# Patient Record
Sex: Male | Born: 1965 | Race: White | Hispanic: No | Smoking: Never smoker
Health system: Southern US, Community
[De-identification: ages and names within clinical notes are randomized; demographics above are authoritative.]

## PROBLEM LIST (undated history)

## (undated) DIAGNOSIS — M549 Dorsalgia, unspecified: Secondary | ICD-10-CM

## (undated) DIAGNOSIS — R112 Nausea with vomiting, unspecified: Secondary | ICD-10-CM

## (undated) DIAGNOSIS — Z9889 Other specified postprocedural states: Secondary | ICD-10-CM

## (undated) DIAGNOSIS — G4730 Sleep apnea, unspecified: Secondary | ICD-10-CM

## (undated) DIAGNOSIS — M542 Cervicalgia: Secondary | ICD-10-CM

## (undated) DIAGNOSIS — K219 Gastro-esophageal reflux disease without esophagitis: Secondary | ICD-10-CM

## (undated) DIAGNOSIS — M1990 Unspecified osteoarthritis, unspecified site: Secondary | ICD-10-CM

## (undated) HISTORY — PX: KNEE ARTHROSCOPY: SHX127

## (undated) HISTORY — PX: OSTEOTOMY: SHX137

## (undated) HISTORY — PX: CARPAL TUNNEL RELEASE: SHX101

## (undated) HISTORY — DX: Gastro-esophageal reflux disease without esophagitis: K21.9

## (undated) HISTORY — DX: Cervicalgia: M54.2

## (undated) HISTORY — PX: OTHER SURGICAL HISTORY: SHX169

---

## 2006-02-19 ENCOUNTER — Ambulatory Visit (HOSPITAL_COMMUNITY): Admission: RE | Admit: 2006-02-19 | Discharge: 2006-02-19 | Payer: Self-pay | Admitting: Orthopedic Surgery

## 2013-12-26 ENCOUNTER — Encounter: Payer: Self-pay | Admitting: Family

## 2013-12-26 ENCOUNTER — Ambulatory Visit (INDEPENDENT_AMBULATORY_CARE_PROVIDER_SITE_OTHER): Payer: BC Managed Care – PPO | Admitting: Family

## 2013-12-26 ENCOUNTER — Encounter (INDEPENDENT_AMBULATORY_CARE_PROVIDER_SITE_OTHER): Payer: Self-pay

## 2013-12-26 VITALS — BP 125/79 | HR 78 | Temp 99.0°F | Ht 68.0 in | Wt 245.0 lb

## 2013-12-26 DIAGNOSIS — T1590XA Foreign body on external eye, part unspecified, unspecified eye, initial encounter: Secondary | ICD-10-CM

## 2013-12-26 DIAGNOSIS — T1591XA Foreign body on external eye, part unspecified, right eye, initial encounter: Secondary | ICD-10-CM

## 2013-12-26 NOTE — Patient Instructions (Signed)
Eye, Foreign Body The term foreign body refers to any object near, on the surface of or in the eye that should not be there. A foreign body may be a small speck of dirt or dust, a hair or eyelash, a splinter or any object. CAUSES  Foreign bodies can get in the eye by:  Flying pieces of something that was broken or destroyed (debris).  A sudden injury (trauma) to the eye. SYMPTOMS  Symptoms depend on what the foreign body is and where it is in the eye. The most common locations are:  On the inner surface of the upper or lower eyelids or on the covering of the white part of the eye (conjunctiva). Symptoms in this location are:  Irritating and painful, especially when blinking.  Feeling like something is in the eye.  On the surface of the clear covering on the front of the eye (cornea). A corneal foreign body has symptoms that:  Are painful and irritating since the cornea is very sensitive.  Form small "rust rings" around a metallic foreign body. Metallic foreign bodies stick more firmly to the surface of the cornea.  Inside the eyeball. Infection can happen fast and can be hard to treat with antibiotics. This is an extremely dangerous situation. Foreign bodies inside the eye can threaten vision. A person may even loose their eye. Foreign bodies inside the eye may cause:  Great pain.  Immediate loss of vision. DIAGNOSIS  Foreign bodies are found during an exam by an eye specialist. Those that are on the eyelids, conjunctiva or cornea are usually (but not always) easily found. When a foreign body is inside the eyeball, a cataract may form almost right away. This makes it hard for an ophthalmologist to find the foreign body. Special tests may be needed, including ultrasound testing, X-rays and CT scans. TREATMENT   Foreign bodies that are on the eyelids, conjunctiva or cornea are often removed easily and painlessly.  If the foreign body has caused a scratch or abrasion of the cornea,  antibiotic drops, ointments and/or a tight patch called a "pressure patch" may be needed. Follow-up exams will be needed for several days until the abrasion heals.  Surgery is needed right away if the foreign body is inside the eyeball. This is a medical emergency. An antibiotic therapy will likely be given to stop an infection. HOME CARE INSTRUCTIONS  The use of eye patches is not universal. Their use varies from state to state and from caregiver to caregiver. If an eye patch was applied:  Keep the eye patch on for as long as directed by your caregiver until the follow-up appointment.  Do not remove the patch to put in medications unless instructed to do so. When replacing the patch, retape it as it was before. Follow the same procedure if the patch becomes loose.  WARNING: Do not drive or operate machinery while the eye is patched. The ability to judge distances will be impaired.  Only take over-the-counter or prescription medicines for pain, discomfort or fever as directed by the caregiver. If no eye patch was applied:  Keep the eye closed as much as possible. Do not rub the eye.  Wear dark glasses as needed to protect the eyes from bright light.  Do not wear contact lenses until the eye feels normal again, or as instructed.  Wear protective eye covering if there is a risk of eye injury. This is important when working with high speed tools.  Only take over-the-counter or   prescription medicines for pain, discomfort or fever as directed by the caregiver. SEEK IMMEDIATE MEDICAL CARE IF:   Pain increases in the eye or the vision changes.  You or your child has problems with the eye patch.  The injury to the eye appears to be getting larger.  There is discharge from the injured eye.  Swelling and/or soreness (inflammation) develops around the affected eye.  You or your child has an oral temperature above 102 F (38.9 C), not controlled by medicine.  Your baby is older than 3  months with a rectal temperature of 102 F (38.9 C) or higher.  Your baby is 3 months old or younger with a rectal temperature of 100.4 F (38 C) or higher. MAKE SURE YOU:   Understand these instructions.  Will watch your condition.  Will get help right away if you are not doing well or get worse. Document Released: 04/27/2005 Document Revised: 07/20/2011 Document Reviewed: 09/22/2012 Exit[employer] Patient Information 2015 [employer]. This information is not intended to replace advice given to you by your health care provider. Make sure you discuss any questions you have with your health care provider.  

## 2013-12-26 NOTE — Progress Notes (Signed)
   Subjective:    Patient ID: Mason Ross, male    DOB: May 20, 1965, 48 y.o.   MRN: [id number]  Eye Pain    Pt presents to the office for something in his right eye since yesterday. Pt states he felt something in it yesterday, but can not seem to get it out. Pt states he has used eye drops and flushed with 32 oz of water this AM, but still feels like something is in there. Pt denies any pain, SOB, palpations, or edema.    Review of Systems  Constitutional: Negative.   HENT: Negative.   Eyes: Positive for pain.  Respiratory: Negative.   Cardiovascular: Negative.   Gastrointestinal: Negative.   Endocrine: Negative.   Genitourinary: Negative.   Musculoskeletal: Negative.   Neurological: Negative.   Hematological: Negative.   Psychiatric/Behavioral: Negative.   All other systems reviewed and are negative.      Objective:   Physical Exam  Vitals reviewed. Constitutional: He is oriented to person, place, and time. He appears well-developed and well-nourished. No distress.  HENT:  Head: Normocephalic.  Right Ear: External ear normal.  Left Ear: External ear normal.  Mouth/Throat: Oropharynx is clear and moist.  Eyes: Right eye exhibits no discharge. Left eye exhibits no discharge.  Right eye irritated and erythemas    Neck: Normal range of motion. Neck supple. No thyromegaly present.  Cardiovascular: Normal rate, regular rhythm, normal heart sounds and intact distal pulses.   No murmur heard. Pulmonary/Chest: Effort normal and breath sounds normal. No respiratory distress. He has no wheezes.  Abdominal: Soft. Bowel sounds are normal. He exhibits no distension. There is no tenderness.  Musculoskeletal: Normal range of motion. He exhibits no edema and no tenderness.  Neurological: He is alert and oriented to person, place, and time. He has normal reflexes. No cranial nerve deficit.  Skin: Skin is warm and dry. No rash noted. No erythema.  Psychiatric: He has a normal mood and  affect. His behavior is normal. Judgment and thought content normal.    BP 125/79  Pulse 78  Temp(Src) 99 F (37.2 C) (Oral)  Ht 5\' 8"  (1.727 m)  Wt 245 lb (111.131 kg)  BMI 37.26 kg/m2   Right flushed with Sterile eye irrigating solution for several minutes     Assessment & Plan:  1. Foreign body in eye, right, initial encounter -Do not rub eye -If pt develops a fever or redness gets worse-Pt to call to get antibiotic  -keep appointment with eye Dr on Monday  Jannifer Rodneyhristy Hawks, FNP

## 2019-04-11 DIAGNOSIS — U071 COVID-19: Secondary | ICD-10-CM

## 2019-04-11 HISTORY — DX: COVID-19: U07.1

## 2019-04-28 ENCOUNTER — Encounter (HOSPITAL_COMMUNITY): Payer: Self-pay | Admitting: *Deleted

## 2019-04-28 ENCOUNTER — Emergency Department (HOSPITAL_COMMUNITY)
Admission: EM | Admit: 2019-04-28 | Discharge: 2019-04-28 | Disposition: A | Discharge status: Home / Self Care | Payer: No Typology Code available for payment source | Attending: Emergency Medicine | Admitting: Emergency Medicine

## 2019-04-28 ENCOUNTER — Emergency Department (HOSPITAL_COMMUNITY): Payer: No Typology Code available for payment source

## 2019-04-28 ENCOUNTER — Other Ambulatory Visit: Payer: Self-pay

## 2019-04-28 DIAGNOSIS — U071 COVID-19: Secondary | ICD-10-CM | POA: Insufficient documentation

## 2019-04-28 DIAGNOSIS — R7989 Other specified abnormal findings of blood chemistry: Secondary | ICD-10-CM

## 2019-04-28 DIAGNOSIS — R739 Hyperglycemia, unspecified: Secondary | ICD-10-CM | POA: Insufficient documentation

## 2019-04-28 DIAGNOSIS — R509 Fever, unspecified: Secondary | ICD-10-CM | POA: Diagnosis present

## 2019-04-28 HISTORY — DX: Dorsalgia, unspecified: M54.9

## 2019-04-28 HISTORY — DX: Unspecified osteoarthritis, unspecified site: M19.90

## 2019-04-28 LAB — URINALYSIS, ROUTINE W REFLEX MICROSCOPIC
Bilirubin Urine: NEGATIVE
Glucose, UA: NEGATIVE mg/dL
Hgb urine dipstick: NEGATIVE
Ketones, ur: NEGATIVE mg/dL
Leukocytes,Ua: NEGATIVE
Nitrite: NEGATIVE
Protein, ur: NEGATIVE mg/dL
Specific Gravity, Urine: 1.005 (ref 1.005–1.030)
pH: 6 (ref 5.0–8.0)

## 2019-04-28 LAB — CBC WITH DIFFERENTIAL/PLATELET
Abs Immature Granulocytes: 0.01 10*3/uL (ref 0.00–0.07)
Basophils Absolute: 0 10*3/uL (ref 0.0–0.1)
Basophils Relative: 0 %
Eosinophils Absolute: 0 10*3/uL (ref 0.0–0.5)
Eosinophils Relative: 1 %
HCT: 47.8 % (ref 39.0–52.0)
Hemoglobin: 16.2 g/dL (ref 13.0–17.0)
Immature Granulocytes: 0 %
Lymphocytes Relative: 19 %
Lymphs Abs: 0.8 10*3/uL (ref 0.7–4.0)
MCH: 29.9 pg (ref 26.0–34.0)
MCHC: 33.9 g/dL (ref 30.0–36.0)
MCV: 88.2 fL (ref 80.0–100.0)
Monocytes Absolute: 0.4 10*3/uL (ref 0.1–1.0)
Monocytes Relative: 11 %
Neutro Abs: 2.9 10*3/uL (ref 1.7–7.7)
Neutrophils Relative %: 69 %
Platelets: 167 10*3/uL (ref 150–400)
RBC: 5.42 MIL/uL (ref 4.22–5.81)
RDW: 12.8 % (ref 11.5–15.5)
WBC: 4.2 10*3/uL (ref 4.0–10.5)
nRBC: 0 % (ref 0.0–0.2)

## 2019-04-28 LAB — COMPREHENSIVE METABOLIC PANEL
ALT: 82 U/L — ABNORMAL HIGH (ref 0–44)
AST: 58 U/L — ABNORMAL HIGH (ref 15–41)
Albumin: 3.9 g/dL (ref 3.5–5.0)
Alkaline Phosphatase: 52 U/L (ref 38–126)
Anion gap: 12 (ref 5–15)
BUN: 9 mg/dL (ref 6–20)
CO2: 23 mmol/L (ref 22–32)
Calcium: 8.6 mg/dL — ABNORMAL LOW (ref 8.9–10.3)
Chloride: 102 mmol/L (ref 98–111)
Creatinine, Ser: 1.21 mg/dL (ref 0.61–1.24)
GFR calc Af Amer: >60 mL/min (ref >60)
GFR calc non Af Amer: >60 mL/min (ref >60)
Glucose, Bld: 177 mg/dL — ABNORMAL HIGH (ref 70–99)
Potassium: 3.9 mmol/L (ref 3.5–5.1)
Sodium: 137 mmol/L (ref 135–145)
Total Bilirubin: 0.7 mg/dL (ref 0.3–1.2)
Total Protein: 7.5 g/dL (ref 6.5–8.1)

## 2019-04-28 LAB — RESPIRATORY PANEL BY RT PCR (FLU A&B, COVID)
Influenza A by PCR: NEGATIVE
Influenza B by PCR: NEGATIVE
SARS Coronavirus 2 by RT PCR: POSITIVE — AB

## 2019-04-28 MED ORDER — ALBUTEROL SULFATE HFA 108 (90 BASE) MCG/ACT IN AERS
2.0000 | INHALATION_SPRAY | Freq: Once | RESPIRATORY_TRACT | Status: AC
  Administered 2019-04-28: 10:00:00 2 via RESPIRATORY_TRACT
  Filled 2019-04-28: qty 6.7

## 2019-04-28 MED ORDER — ACETAMINOPHEN 500 MG PO TABS
1000.0000 mg | ORAL_TABLET | Freq: Once | ORAL | Status: DC
  Filled 2019-04-28: qty 2

## 2019-04-28 MED ORDER — SODIUM CHLORIDE 0.9 % IV BOLUS
500.0000 mL | Freq: Once | INTRAVENOUS | Status: AC
  Administered 2019-04-28: 500 mL via INTRAVENOUS

## 2019-04-28 MED ORDER — FLUTICASONE PROPIONATE 50 MCG/ACT NA SUSP: 1.0000 | Freq: Every day | NASAL | 0 refills | Status: DC

## 2019-04-28 MED ORDER — NAPROXEN 500 MG PO TABS: 500.0000 mg | ORAL_TABLET | Freq: Two times a day (BID) | ORAL | 0 refills | Status: DC

## 2019-04-28 MED ORDER — AEROCHAMBER PLUS FLO-VU MEDIUM MISC
1.0000 | Freq: Once | Status: AC
  Administered 2019-04-28: 1
  Filled 2019-04-28: qty 1

## 2019-04-28 MED ORDER — BENZONATATE 100 MG PO CAPS: 100.0000 mg | ORAL_CAPSULE | Freq: Three times a day (TID) | ORAL | 0 refills | Status: DC

## 2019-04-28 NOTE — ED Notes (Signed)
Date and time results received: 04/28/19 1038 (use smartphrase ".now" to insert current time)  Test: Covid Critical Value: Positive  Name of Provider Notified: S. Petrucelli PA  Orders Received? Or Actions Taken?: NA

## 2019-04-28 NOTE — ED Triage Notes (Signed)
Fever, onset yesterday

## 2019-04-28 NOTE — ED Provider Notes (Signed)
[facility] EMERGENCY DEPARTMENT Provider Note   CSN: [id number] Arrival date & time: 04/28/19  1607     History Chief Complaint  Patient presents with  . Fever    Mason Ross is a 53 y.o. male with a history of GERD who presents to the ED with complaints of fever for the past 5 days. Patient states he has had fever, chills, body aches, nasal congestion, ear pain, cough productive of phlegm sputum, dyspnea & chest pain with coughing otherwise no significant chest pain. Taking OTC NSAIDs at home with minimal relief. No other alleviating/aggravating factors. No recent known covid 19 exposure. Denies N/V/D, abdominal pain, syncope,  leg pain/swelling, hemoptysis, recent surgery/trauma, recent long travel, hormone use, personal hx of cancer, or hx of DVT/PE.    HPI     Past Medical History:  Diagnosis Date  . Arthritis   . Back pain   . GERD (gastroesophageal reflux disease)     There are no problems to display for this patient.   Past Surgical History:  Procedure Laterality Date  . CARPAL TUNNEL RELEASE Bilateral   . KNEE ARTHROSCOPY    . OSTEOTOMY Bilateral    arms       Family History  Problem Relation Age of Onset  . Cancer Father        prostate    Social History   Tobacco Use  . Smoking status: Never Smoker  . Smokeless tobacco: Never Used  Substance Use Topics  . Alcohol use: Yes    Alcohol/week: 6.0 standard drinks    Types: 6 Cans of beer per week  . Drug use: No    Home Medications Prior to Admission medications   Medication Sig Start Date End Date Taking? Authorizing Provider  omeprazole (PRILOSEC) 20 MG capsule Take 20 mg by mouth daily.    [provider]    Allergies    Patient has no known allergies.  Review of Systems   Review of Systems  Constitutional: Positive for chills, fatigue and fever.  HENT: Positive for congestion, ear pain and sore throat.   Respiratory: Positive for cough and shortness of breath.    Cardiovascular: Positive for chest pain (with coughing). Negative for leg swelling.  Gastrointestinal: Negative for abdominal pain, diarrhea, nausea and vomiting.  Musculoskeletal: Positive for arthralgias and myalgias.  Skin: Negative for rash.  Neurological: Negative for syncope, weakness and numbness.  All other systems reviewed and are negative.   Physical Exam Updated Vital Signs BP 131/69   Pulse 92   Temp 100 F (37.8 C)   Resp 20   Ht 5' 6.5" (1.689 m)   Wt 117.9 kg   SpO2 92%   BMI 41.34 kg/m   Physical Exam Vitals and nursing note reviewed.  Constitutional:      General: He is not in acute distress.    Appearance: He is well-developed. He is not toxic-appearing.  HENT:     Head: Normocephalic and atraumatic.     Right Ear: Ear canal normal. Tympanic membrane is not perforated, erythematous, retracted or bulging.     Left Ear: Ear canal normal. Tympanic membrane is not perforated, erythematous, retracted or bulging.     Ears:     Comments: No mastoid erythema/swellng/tenderness.     Nose:     Right Sinus: No maxillary sinus tenderness or frontal sinus tenderness.     Left Sinus: No maxillary sinus tenderness or frontal sinus tenderness.     Mouth/Throat:  Pharynx: Oropharynx is clear. Uvula midline. No oropharyngeal exudate or posterior oropharyngeal erythema.     Comments: Posterior oropharynx is symmetric appearing. Patient tolerating own secretions without difficulty. No trismus. No drooling. No hot potato voice. No swelling beneath the tongue, submandibular compartment is soft.  Eyes:     General:        Right eye: No discharge.        Left eye: No discharge.     Conjunctiva/sclera: Conjunctivae normal.  Cardiovascular:     Rate and Rhythm: Normal rate and regular rhythm.  Pulmonary:     Effort: Pulmonary effort is normal. No respiratory distress.     Breath sounds: Normal breath sounds. No wheezing, rhonchi or rales.     Comments: SpO2 97% on RA @  rest, maintaining SpO2  > 95% with ambulation.  Chest:     Chest wall: Tenderness (anterior chest wall) present.  Abdominal:     General: There is no distension.     Palpations: Abdomen is soft.     Tenderness: There is no abdominal tenderness. There is no right CVA tenderness, left CVA tenderness, guarding or rebound.  Musculoskeletal:     Cervical back: Neck supple. No rigidity.  Lymphadenopathy:     Cervical: No cervical adenopathy.  Skin:    General: Skin is warm and dry.     Findings: No rash.  Neurological:     Mental Status: He is alert.  Psychiatric:        Behavior: Behavior normal.     ED Results / Procedures / Treatments   Labs (all labs ordered are listed, but only abnormal results are displayed) Labs Reviewed  RESPIRATORY PANEL BY RT PCR (FLU A&B, COVID) - Abnormal; Notable for the following components:      Result Value   SARS Coronavirus 2 by RT PCR POSITIVE (*)    All other components within normal limits  COMPREHENSIVE METABOLIC PANEL - Abnormal; Notable for the following components:   Glucose, Bld 177 (*)    Calcium 8.6 (*)    AST 58 (*)    ALT 82 (*)    All other components within normal limits  URINALYSIS, ROUTINE W REFLEX MICROSCOPIC - Abnormal; Notable for the following components:   Color, Urine STRAW (*)    All other components within normal limits  CBC WITH DIFFERENTIAL/PLATELET    EKG EKG Interpretation  Date/Time:  Friday April 28 2019 09:27:28 EST Ventricular Rate:  88 PR Interval:    QRS Duration: 101 QT Interval:  357 QTC Calculation: 432 R Axis:   51 Text Interpretation: Sinus rhythm Low voltage, precordial leads Probable anteroseptal infarct, old Confirmed by Elnora Morrison [phone]) on 04/28/2019 11:46:45 AM   Radiology DG Chest Port 1 View  Result Date: 04/28/2019 CLINICAL DATA:  Dyspnea EXAM: PORTABLE CHEST 1 VIEW COMPARISON:  None. FINDINGS: Heart size appears mildly enlarged. Airspace opacity within the peripheral aspect  of the left lower lobe. Right lung appears clear. No pleural effusion or pneumothorax. IMPRESSION: 1. Airspace opacity within the peripheral aspect of the left lower lobe, suspicious for pneumonia. Radiographic follow-up to resolution is recommended. 2. Mild cardiomegaly. Electronically Signed   By: Davina Poke M.D.   On: 04/28/2019 09:51    Procedures Procedures (including critical care time)  Medications Ordered in ED Medications - No data to display  ED Course  I have reviewed the triage vital signs and the nursing notes.  Pertinent labs & imaging results that were available during my  care of the patient were reviewed by me and considered in my medical decision making (see chart for details).    MDM Rules/Calculators/A&P                     Patient presents to the ED with fever, body aches, URI sxs, & respiratory sxs over the past 5 days.  He is nontoxic appearing, initial temp 100, SpO2 on my assessment 92% @ rest. Lungs CTA. Not in respiraotry distress. Chest discomfort reproducible with chest wall palpation. Plan for labs, CXR, & covid/influenza testing.   EKG: no STEMI CBC: No anemia/leukocytosis.  CMP: Hyperglycemia w/o acidosis or anion gap elevation. Mild hypocalcemia. LFTs mildly elevated, no prior on records. Renal function preserved.  UA: No UTI CXR:  1. Airspace opacity within the peripheral aspect of the left lower lobe, suspicious for pneumonia. Radiographic follow-up to resolution is recommended. 2. Mild cardiomegaly.  Influenza: Negative COVID: Positive   Suspect symptoms are primarily due to COVID-19.  Chest pain reproducible.  Lungs clear.  Low risk Wells, doubt PE.  Laboratory work-up overall reassuring.  I personally ambulated the patient and he maintained his SPO2 at greater than 95%.  He appears appropriate for discharge home with supportive care.  His chest x-ray does show findings of pneumonia, discussed with supervising physician Dr. Jodi MourningZavitz who has reviewed  imaging and would not give antibiotics which I am in agreement with.  I discussed results, treatment plan, need for follow-up, and return precautions with the patient. Provided opportunity for questions, patient confirmed understanding and is in agreement with plan.   Findings and plan of care discussed with supervising physician Dr. Jodi MourningZavitz who is in agreement.    Mason Ross was evaluated in Emergency Department on 04/28/2019 for the symptoms described in the history of present illness. He/she was evaluated in the context of the global COVID-19 pandemic, which necessitated consideration that the patient might be at risk for infection with the SARS-CoV-2 virus that causes COVID-19. Institutional protocols and algorithms that pertain to the evaluation of patients at risk for COVID-19 are in a state of rapid change based on information released by regulatory bodies including the CDC and federal and state organizations. These policies and algorithms were followed during the patient's care in the ED.   Final Clinical Impression(s) / ED Diagnoses Final diagnoses:  COVID-19  Hyperglycemia  Elevated LFTs    Rx / DC Orders ED Discharge Orders         Ordered    fluticasone (FLONASE) 50 MCG/ACT nasal spray  Daily     04/28/19 1152    benzonatate (TESSALON) 100 MG capsule  Every 8 hours     04/28/19 1152    naproxen (NAPROSYN) 500 MG tablet  2 times daily     04/28/19 [address]1152           Jhett Fretwell, Duane LakeSamantha R, PA-C 04/28/19 1213    Blane OharaZavitz, Joshua, MD 04/29/19 [phone]

## 2019-04-28 NOTE — Discharge Instructions (Addendum)
You were seen in the emergency department today for fever and trouble breathing.  Your work-up showed that you are positive for COVID-19 which is likely causing your symptoms.  Your labs did show that your liver function tests were mildly elevated, this is likely due to your COVID-19 infection but should be rechecked by primary care provider in 1 to 2 weeks.  Your blood sugar was noted to be elevated, have this rechecked as well.   Your chest x-ray showed findings of pneumonia, this is likely due to COVID-19.  We would like you to have a repeat chest x-ray in 1 to 2 weeks to ensure improvement.  We are sending you home with the following medicines: -Flonase: This is medicine to help with nasal congestion, use 1 spray per nostril daily -Tessalon: This is a medicine to help with coughing, use every 8 hours as needed -Albuterol inhaler: Use 1 to 2 puffs every 4 6 hours as needed for wheezing/shortness of breath -Naproxen: This is a medicine to help with headache and chest pain with coughing.  You may take this medicine every 12 hours as needed.  Be sure to take this with food as it can cause stomach upset and or stomach bleeding.  Do not take this with other NSAIDs such as ibuprofen, Advil, Aleve, Motrin, Goody powder, Mobic, etc.  We have prescribed you new medication(s) today. Discuss the medications prescribed today with your pharmacist as they can have adverse effects and interactions with your other medicines including over the counter and prescribed medications. Seek medical evaluation if you start to experience new or abnormal symptoms after taking one of these medicines, seek care immediately if you start to experience difficulty breathing, feeling of your throat closing, facial swelling, or rash as these could be indications of a more serious allergic reaction  We are instructing patients with COVID-19 to quarantine themselves for 14 days. You may be able to discontinue self quarantine if the  following conditions are met:   Persons with COVID-19 who have symptoms and were directed to care for themselves at home may discontinue home isolation under the  following conditions: - It has been at least 7 days have passed since symptoms first appeared. - AND at least 3 days (72 hours) have passed since recovery defined as resolution of fever without the use of fever-reducing medications and improvement in respiratory symptoms (e.g., cough, shortness of breath)  Please follow the below quarantine instructions.   Please follow up with primary care within 3-5 days for re-evaluation- call prior to going to the office to make them aware of your symptoms. Return to the ER for new or worsening symptoms including but not limited to increased work of breathing, chest pain, passing out, or any other concerns.       Person Under Monitoring Name: Mason Ross  Location: [address] [place] [address]   Infection Prevention Recommendations for Individuals Confirmed to have, or Being Evaluated for, 2019 Novel Coronavirus (COVID-19) Infection Who Receive Care at Home  Individuals who are confirmed to have, or are being evaluated for, COVID-19 should follow the prevention steps below until a healthcare provider or local or state health department says they can return to normal activities.  Stay home except to get medical care You should restrict activities outside your home, except for getting medical care. Do not go to work, school, or public areas, and do not use public transportation or taxis.  Call ahead before visiting your doctor Before your medical  appointment, call the healthcare provider and tell them that you have, or are being evaluated for, COVID-19 infection. This will help the healthcare provider's office take steps to keep other people from getting infected. Ask your healthcare provider to call the local or state health department.  Monitor your symptoms Seek prompt  medical attention if your illness is worsening (e.g., difficulty breathing). Before going to your medical appointment, call the healthcare provider and tell them that you have, or are being evaluated for, COVID-19 infection. Ask your healthcare provider to call the local or state health department.  Wear a facemask You should wear a facemask that covers your nose and mouth when you are in the same room with other people and when you visit a healthcare provider. People who live with or visit you should also wear a facemask while they are in the same room with you.  Separate yourself from other people in your home As much as possible, you should stay in a different room from other people in your home. Also, you should use a separate bathroom, if available.  Avoid sharing household items You should not share dishes, drinking glasses, cups, eating utensils, towels, bedding, or other items with other people in your home. After using these items, you should wash them thoroughly with soap and water.  Cover your coughs and sneezes Cover your mouth and nose with a tissue when you cough or sneeze, or you can cough or sneeze into your sleeve. Throw used tissues in a lined trash can, and immediately wash your hands with soap and water for at least 20 seconds or use an alcohol-based hand rub.  Wash your [facility] your hands often and thoroughly with soap and water for at least 20 seconds. You can use an alcohol-based hand sanitizer if soap and water are not available and if your hands are not visibly dirty. Avoid touching your eyes, nose, and mouth with unwashed hands.   Prevention Steps for Caregivers and Household Members of Individuals Confirmed to have, or Being Evaluated for, COVID-19 Infection Being Cared for in the Home  If you live with, or provide care at home for, a person confirmed to have, or being evaluated for, COVID-19 infection please follow these guidelines to prevent  infection:  Follow healthcare provider's instructions Make sure that you understand and can help the patient follow any healthcare provider instructions for all care.  Provide for the patient's basic needs You should help the patient with basic needs in the home and provide support for getting groceries, prescriptions, and other personal needs.  Monitor the patient's symptoms If they are getting sicker, call his or her medical provider and tell them that the patient has, or is being evaluated for, COVID-19 infection. This will help the healthcare provider's office take steps to keep other people from getting infected. Ask the healthcare provider to call the local or state health department.  Limit the number of people who have contact with the patient If possible, have only one caregiver for the patient. Other household members should stay in another home or place of residence. If this is not possible, they should stay in another room, or be separated from the patient as much as possible. Use a separate bathroom, if available. Restrict visitors who do not have an essential need to be in the home.  Keep older adults, very young children, and other sick people away from the patient Keep older adults, very young children, and those who have compromised immune systems  or chronic health conditions away from the patient. This includes people with chronic heart, lung, or kidney conditions, diabetes, and cancer.  Ensure good ventilation Make sure that shared spaces in the home have good air flow, such as from an air conditioner or an opened window, weather permitting.  Wash your hands often Wash your hands often and thoroughly with soap and water for at least 20 seconds. You can use an alcohol based hand sanitizer if soap and water are not available and if your hands are not visibly dirty. Avoid touching your eyes, nose, and mouth with unwashed hands. Use disposable paper towels to dry your  hands. If not available, use dedicated cloth towels and replace them when they become wet.  Wear a facemask and gloves Wear a disposable facemask at all times in the room and gloves when you touch or have contact with the patient's blood, body fluids, and/or secretions or excretions, such as sweat, saliva, sputum, nasal mucus, vomit, urine, or feces.  Ensure the mask fits over your nose and mouth tightly, and do not touch it during use. Throw out disposable facemasks and gloves after using them. Do not reuse. Wash your hands immediately after removing your facemask and gloves. If your personal clothing becomes contaminated, carefully remove clothing and launder. Wash your hands after handling contaminated clothing. Place all used disposable facemasks, gloves, and other waste in a lined container before disposing them with other household waste. Remove gloves and wash your hands immediately after handling these items.  Do not share dishes, glasses, or other household items with the patient Avoid sharing household items. You should not share dishes, drinking glasses, cups, eating utensils, towels, bedding, or other items with a patient who is confirmed to have, or being evaluated for, COVID-19 infection. After the person uses these items, you should wash them thoroughly with soap and water.  Wash laundry thoroughly Immediately remove and wash clothes or bedding that have blood, body fluids, and/or secretions or excretions, such as sweat, saliva, sputum, nasal mucus, vomit, urine, or feces, on them. Wear gloves when handling laundry from the patient. Read and follow directions on labels of laundry or clothing items and detergent. In general, wash and dry with the warmest temperatures recommended on the label.  Clean all areas the individual has used often Clean all touchable surfaces, such as counters, tabletops, doorknobs, bathroom fixtures, toilets, phones, keyboards, tablets, and bedside tables,  every day. Also, clean any surfaces that may have blood, body fluids, and/or secretions or excretions on them. Wear gloves when cleaning surfaces the patient has come in contact with. Use a diluted bleach solution (e.g., dilute bleach with 1 part bleach and 10 parts water) or a household disinfectant with a label that says EPA-registered for coronaviruses. To make a bleach solution at home, add 1 tablespoon of bleach to 1 quart (4 cups) of water. For a larger supply, add  cup of bleach to 1 gallon (16 cups) of water. Read labels of cleaning products and follow recommendations provided on product labels. Labels contain instructions for safe and effective use of the cleaning product including precautions you should take when applying the product, such as wearing gloves or eye protection and making sure you have good ventilation during use of the product. Remove gloves and wash hands immediately after cleaning.  Monitor yourself for signs and symptoms of illness Caregivers and household members are considered close contacts, should monitor their health, and will be asked to limit movement outside of the home  to the extent possible. Follow the monitoring steps for close contacts listed on the symptom monitoring form.   ? If you have additional questions, contact your local health department or call the epidemiologist on call at [phone] (available 24/7). ? This guidance is subject to change. For the most up-to-date guidance from [facility], please refer to their website: [email or web address]

## 2019-04-28 NOTE — ED Notes (Signed)
Also c/o body aches and feeling short of breath

## 2019-09-19 ENCOUNTER — Encounter (INDEPENDENT_AMBULATORY_CARE_PROVIDER_SITE_OTHER): Payer: No Typology Code available for payment source | Admitting: Neurology

## 2019-09-19 ENCOUNTER — Other Ambulatory Visit: Payer: Self-pay

## 2019-09-19 ENCOUNTER — Ambulatory Visit (INDEPENDENT_AMBULATORY_CARE_PROVIDER_SITE_OTHER): Payer: No Typology Code available for payment source | Admitting: Neurology

## 2019-09-19 DIAGNOSIS — M5412 Radiculopathy, cervical region: Secondary | ICD-10-CM

## 2019-09-19 DIAGNOSIS — Z0289 Encounter for other administrative examinations: Secondary | ICD-10-CM

## 2019-09-19 DIAGNOSIS — R200 Anesthesia of skin: Secondary | ICD-10-CM

## 2019-09-19 DIAGNOSIS — M5416 Radiculopathy, lumbar region: Secondary | ICD-10-CM | POA: Diagnosis not present

## 2019-09-19 DIAGNOSIS — M79609 Pain in unspecified limb: Secondary | ICD-10-CM | POA: Insufficient documentation

## 2019-09-19 NOTE — Progress Notes (Signed)
Full Name: Deloyd Handy Gender: Male MRN #: [id number] Date of Birth: [birth year]    Visit Date: 09/19/2019 07:50 Age: 54 Years Examining Physician: Despina Arias, MD  Referring Physician: Carmel Sacramento, NP Height: 5 feet 6 inch    History: Mr. Hendershott is a 53 year old man with right greater than left neck pain and pain in both arms, right greater than left.  He also has pain in the lower back and the right leg.  On exam, he had slight asymmetry of biceps strength, weaker on the right.  Strength and sensation was normal elsewhere.  Nerve conduction studies: The right median, ulnar, peroneal and tibial motor responses had normal distal latencies, amplitudes and conduction velocities.  The tibial and ulnar F-wave responses had normal latencies.  The right sural, superficial peroneal, median and ulnar sensory responses had normal peak latencies and amplitudes.  The sympathetic skin response of the foot was normal.  Electromyography: Needle EMG of selected muscles of the right arm and leg was performed.  In the arm, some of the C6 and/or C7 innervated muscles showed mild chronic denervation.  In the leg, some of the L4/L3 innervated muscles had either mild chronic denervation or a mildly increased number of polyphasic motor units.  There was no abnormal spontaneous activity in any of the muscles tested.  Impression: This NCV/EMG study shows the following: 1.    There is no evidence of a polyneuropathy or mononeuropathy in the right arm or leg. 2.    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 Epimenio Foot, MD, PhD, FAAN Certified in Neurology, Clinical Neurophysiology, Sleep Medicine, Pain Medicine and Neuroimaging Director, [facility] at [facility]  [facility] [address] [place], [place] [address] [phone]  Verbal informed consent was obtained  from the patient, patient was informed of potential risk of procedure, including bruising, bleeding, hematoma formation, infection, muscle weakness, muscle pain, numbness, among others.        MNC    Nerve / Sites Muscle Latency Ref. Amplitude Ref. Rel Amp Segments Distance Velocity Ref. Area    ms ms mV mV %  cm m/s m/s mVms  R Median - APB     Wrist APB 3.7 ?4.4 11.2 ?4.0 100 Wrist - APB 7   34.3     Upper arm APB 7.7  10.1  89.7 Upper arm - Wrist 21 52 ?49 31.5  R Ulnar - ADM     Wrist ADM 2.4 ?3.3 12.6 ?6.0 100 Wrist - ADM 7   38.8     B.Elbow ADM 6.2  12.0  95.5 B.Elbow - Wrist 21 54 ?49 36.4     A.Elbow ADM 8.1  11.5  95.8 A.Elbow - B.Elbow 10 53 ?49 36.2  R Peroneal - EDB     Ankle EDB 4.0 ?6.5 15.0 ?2.0 100 Ankle - EDB 9   43.7     Fib head EDB 10.4  13.0  86.6 Fib head - Ankle 29 45 ?44 38.6     Pop fossa EDB 11.8  14.3  110 Pop fossa - Fib head 10 68 ?44 45.8         Pop fossa - Ankle      R Tibial - AH     Ankle AH 3.7 ?5.8 13.3 ?4.0 100 Ankle - AH 9   27.7     Pop fossa  AH 12.4  8.0  59.8 Pop fossa - Ankle 40 46 ?41 25.5             SSR      SNC    Nerve / Sites Rec. Site Peak Lat Ref.  Amp Ref. Segments Distance    ms ms V V  cm  R Sural - Ankle (Calf)     Calf Ankle 3.5 ?4.4 20 ?6 Calf - Ankle 14  R Superficial peroneal - Ankle     Lat leg Ankle 4.0 ?4.4 14 ?6 Lat leg - Ankle 14  R Median - Orthodromic (Dig II, Mid palm)     Dig II Wrist 3.0 ?3.4 19 ?10 Dig II - Wrist 13  R Ulnar - Orthodromic, (Dig V, Mid palm)     Dig V Wrist 2.5 ?3.1 9 ?5 Dig V - Wrist 5             F  Wave    Nerve F Lat Ref.   ms ms  R Tibial - AH 50.2 ?56.0  R Ulnar - ADM 29.1 ?32.0         EMG Summary Table    Spontaneous MUAP Recruitment  Muscle IA Fib PSW Fasc Other Amp Dur. Poly Pattern  R. Deltoid Normal None None None _______ Normal Normal 1+ Normal  R. Triceps brachii Normal None None None _______ Normal Normal Normal Normal  R. Biceps brachii Normal None None None  _______ Normal Normal 1+ Reduced  R. Pronator teres Normal None None None _______ Normal Normal Normal Normal  R. Extensor digitorum communis Normal None None None _______ Normal Normal 1+ Reduced  R. Flexor carpi ulnaris Normal None None None _______ Normal Normal Normal Normal  R. First dorsal interosseous Normal None None None _______ Normal Normal 1+ Normal  R. Supraspinatus Normal None None None _______ Normal Normal 1+ Reduced  R. Vastus medialis Normal None None None _______ Normal Normal 1+ Normal  R. Tibialis anterior Normal None None None _______ Normal Normal 1+ Reduced  R. Peroneus longus Normal None None None _______ Normal Normal Normal Normal  R. Gastrocnemius (Medial head) Normal None None None _______ Normal Normal Normal Normal  R. Gluteus medius Normal None None None _______ Normal Normal Normal Normal  R. Iliopsoas Normal None None None _______ Normal Normal 1+ Normal

## 2019-10-02 ENCOUNTER — Encounter: Payer: Self-pay | Admitting: Nurse Practitioner

## 2019-12-11 NOTE — Progress Notes (Signed)
Primary Care Physician:  [facility] Primary Gastroenterologist:  Dr. Marletta Lor  Chief Complaint  Patient presents with  . Colonoscopy    HPI:   Mason Ross is a 54 y.o. male who presents on referral from the [facility] to schedule colonoscopy.  Nurse/phone triage was deferred office visit due to medications and history of alcohol use likely necessitating augmented sedation.  No history of colonoscopy found in our system.  Today he states he is doing okay overall.  He has not had a colonoscopy before. Denies abdominal pain (other than intermittent GERD with triggers), N/V, hematochezia, melena, fever, chills, unintentional weight loss. Denies URI or flu-like symptoms. Denies loss of sense of taste or smell. The patient has received COVID-19 vaccination(s). Denies chest pain, dyspnea, dizziness, lightheadedness, syncope, near syncope. Denies any other upper or lower GI symptoms.   Has sleep apnea, uses CPAP. Setting about 7-8  Past Medical History:  Diagnosis Date  . Arthritis   . Back pain   . COVID-19 virus infection 04/2019   Fully recovered  . GERD (gastroesophageal reflux disease)   . Neck pain     Past Surgical History:  Procedure Laterality Date  . CARPAL TUNNEL RELEASE Bilateral   . CUBITAL RELEASE Bilateral   . KNEE ARTHROSCOPY    . OSTEOTOMY Bilateral    arms    Current Outpatient Medications  Medication Sig Dispense Refill  . celecoxib (CELEBREX) 200 MG capsule Take 200 mg by mouth 2 (two) times daily.    Marland Kitchen ibuprofen (ADVIL) 800 MG tablet Take 800 mg by mouth every 8 (eight) hours as needed.    Marland Kitchen omeprazole (PRILOSEC) 20 MG capsule Take 20 mg by mouth daily.    . Oxcarbazepine (TRILEPTAL) 300 MG tablet Take 600 mg by mouth 2 (two) times daily.     No current facility-administered medications for this visit.    Allergies as of 12/12/2019 - Review Complete 12/12/2019  Allergen Reaction Noted  . Duloxetine  12/12/2019  . Gabapentin  12/12/2019     Family History  Problem Relation Age of Onset  . Cancer Father        prostate  . Colon cancer Neg Hx     Social History   Socioeconomic History  . Marital status: Legally Separated    Spouse name: Not on file  . Number of children: Not on file  . Years of education: Not on file  . Highest education level: Not on file  Occupational History  . Not on file  Tobacco Use  . Smoking status: Never Smoker  . Smokeless tobacco: Current User    Types: Snuff  . Tobacco comment: occasional  Substance and Sexual Activity  . Alcohol use: Yes    Alcohol/week: 6.0 standard drinks    Types: 6 Cans of beer per week    Comment: 12-20 drinks once weekly  . Drug use: No  . Sexual activity: Not on file  Other Topics Concern  . Not on file  Social History Narrative  . Not on file   Social Determinants of Health   Financial Resource Strain:   . Difficulty of Paying Living Expenses:   Food Insecurity:   . Worried About Programme researcher, broadcasting/film/video in the Last Year:   . Barista in the Last Year:   Transportation Needs:   . Freight forwarder (Medical):   Marland Kitchen Lack of Transportation (Non-Medical):   Physical Activity:   . Days of Exercise per  Week:   . Minutes of Exercise per Session:   Stress:   . Feeling of Stress :   Social Connections:   . Frequency of Communication with Friends and Family:   . Frequency of Social Gatherings with Friends and Family:   . Attends Religious Services:   . Active Member of Clubs or Organizations:   . Attends Banker Meetings:   Marland Kitchen Marital Status:   Intimate Partner Violence:   . Fear of Current or Ex-Partner:   . Emotionally Abused:   Marland Kitchen Physically Abused:   . Sexually Abused:     Subjective: Review of Systems  Constitutional: Negative for chills, fever, malaise/fatigue and weight loss.  HENT: Negative for congestion and sore throat.   Respiratory: Negative for cough and shortness of breath.   Cardiovascular: Negative for  chest pain and palpitations.  Gastrointestinal: Positive for heartburn (with dietary indescretions). Negative for abdominal pain, blood in stool, diarrhea, melena, nausea and vomiting.  Musculoskeletal: Negative for joint pain and myalgias.  Skin: Negative for rash.  Neurological: Negative for dizziness and weakness.  Endo/Heme/Allergies: Does not bruise/bleed easily.  Psychiatric/Behavioral: Negative for depression. The patient is not nervous/anxious.   All other systems reviewed and are negative.      Objective: BP 134/87   Pulse 63   Temp 97.7 F (36.5 C) (Oral)   Ht 5\' 6"  (1.676 m)   Wt 263 lb 9.6 oz (119.6 kg)   BMI 42.55 kg/m  Physical Exam Vitals and nursing note reviewed.  Constitutional:      General: He is not in acute distress.    Appearance: Normal appearance. He is obese. He is not ill-appearing, toxic-appearing or diaphoretic.  HENT:     Head: Normocephalic and atraumatic.     Nose: No congestion or rhinorrhea.  Eyes:     General: No scleral icterus. Cardiovascular:     Rate and Rhythm: Normal rate and regular rhythm.     Heart sounds: Normal heart sounds.  Pulmonary:     Effort: Pulmonary effort is normal.     Breath sounds: Normal breath sounds.  Abdominal:     General: Bowel sounds are normal. There is no distension.     Palpations: Abdomen is soft. There is no hepatomegaly, splenomegaly or mass.     Tenderness: There is no abdominal tenderness. There is no guarding or rebound.     Hernia: No hernia is present.  Musculoskeletal:     Cervical back: Neck supple.  Skin:    General: Skin is warm and dry.     Coloration: Skin is not jaundiced.     Findings: No bruising or rash.  Neurological:     General: No focal deficit present.     Mental Status: He is alert and oriented to person, place, and time. Mental status is at baseline.  Psychiatric:        Mood and Affect: Mood normal.        Behavior: Behavior normal.        Thought Content: Thought  content normal.       12/12/2019 9:03 AM   Disclaimer: This note was dictated with voice recognition software. Similar sounding words can inadvertently be transcribed and may not be corrected upon review.

## 2019-12-12 ENCOUNTER — Other Ambulatory Visit: Payer: Self-pay

## 2019-12-12 ENCOUNTER — Ambulatory Visit (INDEPENDENT_AMBULATORY_CARE_PROVIDER_SITE_OTHER): Payer: No Typology Code available for payment source | Admitting: Nurse Practitioner

## 2019-12-12 ENCOUNTER — Encounter: Payer: Self-pay | Admitting: Nurse Practitioner

## 2019-12-12 DIAGNOSIS — Z0000 Encounter for general adult medical examination without abnormal findings: Secondary | ICD-10-CM

## 2019-12-12 DIAGNOSIS — Z1211 Encounter for screening for malignant neoplasm of colon: Secondary | ICD-10-CM

## 2019-12-12 NOTE — Patient Instructions (Signed)
Your health issues we discussed today were:   Need for colonoscopy: 1. As we discussed, we will schedule your colonoscopy for you 2. Further recommendations will be made after colonoscopy  Overall I recommend:  1. Return for follow-up based on any recommendations made after colonoscopy 2. Call us if you have any questions or concerns   ---------------------------------------------------------------  I am glad you have gotten your COVID-19 vaccination!  Even though you are fully vaccinated you should continue to follow CDC and state/local guidelines.  ---------------------------------------------------------------   At [facility] we value your feedback. You may receive a survey about your visit today. Please share your experience as we strive to create trusting relationships with our patients to provide genuine, compassionate, quality care.  We appreciate your understanding and patience as we review any laboratory studies, imaging, and other diagnostic tests that are ordered as we care for you. Our office policy is 5 business days for review of these results, and any emergent or urgent results are addressed in a timely manner for your best interest. If you do not hear from our office in 1 week, please contact us.   We also encourage the use of MyChart, which contains your medical information for your review as well. If you are not enrolled in this feature, an access code is on this after visit summary for your convenience. Thank you for allowing [place] to be involved in your care.  It was great to see you today!  I hope you have a great Summer!!

## 2019-12-12 NOTE — Assessment & Plan Note (Addendum)
The patient has never had a colonoscopy before, currently age 54.  No known family history of colon cancer.  Currently overdue for first ever screening colonoscopy.  No significant GI symptoms other than intermittent GERD based on dietary indiscretions.  He does drink about once a week, 12-20 drinks per sitting.  No red flag or warning signs or symptoms.  We will proceed with scheduling colonoscopy at this time.  Proceed with TCS on propofol/MAC with Dr. Abbey Chatters in near future: the risks, benefits, and alternatives have been discussed with the patient in detail. The patient states understanding and desires to proceed.  ASA III  The patient is on Trileptal. The patient is not on any other anticoagulants, anxiolytics, chronic pain medications, antidepressants, antidiabetics, or iron supplements.  Alcohol use per HPI.  We will plan for the procedure on propofol/MAC.

## 2019-12-18 ENCOUNTER — Telehealth: Payer: Self-pay | Admitting: *Deleted

## 2019-12-18 NOTE — Telephone Encounter (Signed)
Called pt and colonoscopy with propofol with Dr. Marletta Lor scheduled for 9/21 at 9:45am. Aware will mail prep instructions with this appt. Orders entered.

## 2019-12-19 ENCOUNTER — Encounter: Payer: Self-pay | Admitting: *Deleted

## 2020-01-09 ENCOUNTER — Ambulatory Visit (INDEPENDENT_AMBULATORY_CARE_PROVIDER_SITE_OTHER): Payer: No Typology Code available for payment source | Admitting: Clinical

## 2020-01-09 ENCOUNTER — Other Ambulatory Visit: Payer: Self-pay

## 2020-01-09 DIAGNOSIS — F1020 Alcohol dependence, uncomplicated: Secondary | ICD-10-CM | POA: Diagnosis not present

## 2020-01-09 DIAGNOSIS — F419 Anxiety disorder, unspecified: Secondary | ICD-10-CM

## 2020-01-09 DIAGNOSIS — F4310 Post-traumatic stress disorder, unspecified: Secondary | ICD-10-CM

## 2020-01-09 DIAGNOSIS — F331 Major depressive disorder, recurrent, moderate: Secondary | ICD-10-CM | POA: Diagnosis not present

## 2020-01-09 NOTE — Progress Notes (Signed)
Virtual Visit via Telephone Note  I connected with Mason Ross on 01/09/20 at 10:00 AM EDT by telephone and verified that I am speaking with the correct person using two identifiers.  Location: Patient: Home Provider: Office   I discussed the limitations, risks, security and privacy concerns of performing an evaluation and management service by telephone and the availability of in person appointments. I also discussed with the patient that there may be a patient responsible charge related to this service. The patient expressed understanding and agreed to proceed.     Comprehensive Clinical Assessment (CCA) Note  01/09/2020 Mason Ross [id number]  Visit Diagnosis:      ICD-10-CM   1. PTSD (post-traumatic stress disorder)  F43.10   2. 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 Alcohol use disorder, moderate, dependence (HCC)  F10.20       CCA Screening, Triage and Referral (STR)  Patient Reported Information How did you hear about [place]? No data recorded Referral name: No data recorded Referral phone number: No data recorded  Whom do you see for routine medical problems? No data recorded Practice/Facility Name: No data recorded Practice/Facility Phone Number: No data recorded Name of Contact: No data recorded Contact Number: No data recorded Contact Fax Number: No data recorded Prescriber Name: No data recorded Prescriber Address (if known): No data recorded  What Is the Reason for Your Visit/Call Today? No data recorded How Long Has This Been Causing You Problems? No data recorded What Do You Feel Would Help You the Most Today? No data recorded  Have You Recently Been in Any Inpatient Treatment (Hospital/Detox/Crisis Center/28-Day Program)? No data recorded Name/Location of Program/Hospital:No data recorded How Long Were You There? No data recorded When Were You Discharged? No data recorded  Have You Ever Received Services From  [facility] Before? No data recorded Who Do You See at [facility]? No data recorded  Have You Recently Had Any Thoughts About Hurting Yourself? No data recorded Are You Planning to Commit Suicide/Harm Yourself At This time? No data recorded  Have you Recently Had Thoughts About Hurting Someone Karolee Ohs? No data recorded Explanation: No data recorded  Have You Used Any Alcohol or Drugs in the Past 24 Hours? No data recorded How Long Ago Did You Use Drugs or Alcohol? No data recorded What Did You Use and How Much? No data recorded  Do You Currently Have a Therapist/Psychiatrist? No data recorded Name of Therapist/Psychiatrist: No data recorded  Have You Been Recently Discharged From Any Office Practice or Programs? No data recorded Explanation of Discharge From Practice/Program: No data recorded    CCA Screening Triage Referral Assessment Type of Contact: No data recorded Is this Initial or Reassessment? No data recorded Date Telepsych consult ordered in [facility]:  No data recorded Time Telepsych consult ordered in [facility]:  No data recorded  Patient Reported Information Reviewed? No data recorded Patient Left Without Being Seen? No data recorded Reason for Not Completing Assessment: No data recorded  Collateral Involvement: No data recorded  Does Patient Have a Court Appointed Legal Guardian? No data recorded Name and Contact of Legal Guardian: No data recorded If Minor and Not Living with Parent(s), Who has Custody? No data recorded Is CPS involved or ever been involved? No data recorded Is APS involved or ever been involved? No data recorded  Patient Determined To Be At Risk for Harm To Self or Others Based on Review of Patient Reported Information  or Presenting Complaint? No data recorded Method: No data recorded Availability of Means: No data recorded Intent: No data recorded Notification Required: No data recorded Additional Information for Danger to Others Potential: No data  recorded Additional Comments for Danger to Others Potential: No data recorded Are There Guns or Other Weapons in Your Home? No data recorded Types of Guns/Weapons: No data recorded Are These Weapons Safely Secured?                            No data recorded Who Could Verify You Are Able To Have These Secured: No data recorded Do You Have any Outstanding Charges, Pending Court Dates, Parole/Probation? No data recorded Contacted To Inform of Risk of Harm To Self or Others: No data recorded  Location of Assessment: No data recorded  Does Patient Present under Involuntary Commitment? No data recorded IVC Papers Initial File Date: No data recorded  [place] of Residence: No data recorded  Patient Currently Receiving the Following Services: No data recorded  Determination of Need: No data recorded  Options For Referral: No data recorded    CCA Biopsychosocial  Intake/Chief Complaint:  CCA Intake With Chief Complaint CCA Part Two Date: 01/09/20 Chief Complaint/Presenting Problem: PTSD and Depression Patient's Currently Reported Symptoms/Problems: Difficulty controlling emotions and anger, some difficulty with memory, and admits drinking to manage his emotions Individual's Strengths: The patient notes, " I dont have anything i do well". Individual's Preferences: Watching TV, Individual's Abilities: Fishing Type of Services Patient Feels Are Needed: Therapy and Medication Management ( Patient notes he is not willing to do Medication Management). Initial Clinical Notes/Concerns: The patient has previously been diagnosed with PTSD and Depression. [facility] 18[facility] The patient notes physical health issues that keeps him in pain, and is currently doing PT through the [place]  [facility] Symptoms Depression:  Depression: Change in energy/activity, Difficulty Concentrating, Fatigue, Hopelessness, Irritability, Worthlessness, Increase/decrease in appetite, Sleep (too  much or little), Weight gain/loss, Duration of symptoms greater than two weeks  Mania:  Mania: None  Anxiety:   Anxiety: Difficulty concentrating, Fatigue, Irritability, Restlessness, Sleep, Tension, Worrying  Psychosis:  Psychosis: None  Trauma:  Trauma: Detachment from others, Difficulty staying/falling asleep, Irritability/anger, Hypervigilance, Re-experience of traumatic event Media planner, Engineer, manufacturing, and 21yrs in Patent examiner. Loss of 4 family members in close proximity)  Obsessions:  Obsessions: None  Compulsions:  Compulsions: None  Inattention:  Inattention: None  Hyperactivity/Impulsivity:  Hyperactivity/Impulsivity: N/A  Oppositional/Defiant Behaviors:  Oppositional/Defiant Behaviors: None  Emotional Irregularity:  Emotional Irregularity: N/A  Other Mood/Personality Symptoms:  Other Mood/Personality Symptoms: No Additional   Mental Status Exam Appearance and self-care  Stature:  Stature: Average  Weight:  Weight: Overweight  Clothing:  Clothing: Casual  Grooming:  Grooming: Normal  Cosmetic use:  Cosmetic Use: None  Posture/gait:  Posture/Gait: Normal  Motor activity:  Motor Activity: Not Remarkable  Sensorium  Attention:  Attention: Normal  Concentration:  Concentration: Anxiety interferes  Orientation:  Orientation: X5  Recall/memory:  Recall/Memory: Defective in Short-term  Affect and Mood  Affect:  Affect: Depressed  Mood:  Mood: Irritable, Depressed, Angry  Relating  Eye contact:  Eye Contact: Normal  Facial expression:  Facial Expression: Responsive  Attitude toward examiner:  Attitude Toward Examiner: Cooperative  Thought and Language  Speech flow: Speech Flow: Normal  Thought content:  Thought Content: Appropriate to Mood and Circumstances  Preoccupation:  Preoccupations: None  Hallucinations:  Hallucinations: None  Organization:   Systems analystLogical  Executive Functions  Fund of Knowledge:  Fund of Knowledge: Good  Intelligence:  Intelligence: Average   Abstraction:  Abstraction: Normal  Judgement:  Judgement: Fair  Dance movement psychotherapisteality Testing:  Reality Testing: Realistic  Insight:  Insight: Good  Decision Making:  Decision Making: Normal  Social Functioning  Social Maturity:  Social Maturity: Isolates  Social Judgement:  Social Judgement: Normal  Stress  Stressors:  Stressors: Grief/losses, Illness, Work  Coping Ability:  Coping Ability: Building surveyorverwhelmed  Skill Deficits:  Skill Deficits: None  Supports:  Supports: Friends/Service system, Family     Religion: Religion/Spirituality Are You A Religious Person?: Yes What is Your Religious Affiliation?: Christian How Might This Affect Treatment?: Protective Factor  Leisure/Recreation: Leisure / Recreation Do You Have Hobbies?: Yes Leisure and Hobbies: Therapist, musicishing  Exercise/Diet: Exercise/Diet Do You Exercise?: No Have You Gained or Lost A Significant Amount of Weight in the Past Six Months?: Yes-Gained Number of Pounds Gained:  (unsure) Do You Follow a Special Diet?: No Do You Have Any Trouble Sleeping?: Yes Explanation of Sleeping Difficulties: Sleep Apnea   CCA Employment/Education  Employment/Work Situation: Employment / Work Situation Employment situation: Retired Psychologist, clinicalatient's job has been impacted by current illness: No What is the longest time patient has a held a job?: 25 Has patient ever been in the [employer] and Companymilitary?: Yes (Describe in comment)  Education: Education Is Patient Currently Attending School?: No Last Grade Completed: 12 Name of High School: [employer]ired from [employer] Did Garment/textile technologistYou Graduate From [employer]?: Yes ([employer]) Did Theme park managerYou Attend College?: No Did Designer, television/film setYou Attend Graduate School?: No Did You Have Any Scientist, research (life sciences)pecial Interests In School?: NA Did You Have An Individualized Education Program (IIEP): No Did You Have Any Difficulty At [employer] EnergySchool?: No Patient's Education Has Been Impacted by Current Illness: No   CCA Family/Childhood History  Family  and Relationship History: Family history Marital status: Divorced Divorced, when?: 2014 What types of issues is patient dealing with in the relationship?: None Additional relationship information: No Additional Are you sexually active?: Yes What is your sexual orientation?: Heterosexual Has your sexual activity been affected by drugs, alcohol, medication, or emotional stress?: NA Does patient have children?: No  Childhood History:  Childhood History By whom was/is the patient raised?: Both parents Additional childhood history information: No Additional Description of patient's relationship with caregiver when they were a child: The patient notes" I had a wonderful childhood" Patient's description of current relationship with people who raised him/her: Father - Deceased ... Mother-Great How were you disciplined when you got in trouble as a child/adolescent?: Whippings Does patient have siblings?: Yes Number of Siblings: 1 Description of patient's current relationship with siblings: The patient notes, " i have a good relationship with my sister currently". Did patient suffer any verbal/emotional/physical/sexual abuse as a child?: No Did patient suffer from severe childhood neglect?: No Has patient ever been sexually abused/assaulted/raped as an adolescent or adult?: No Was the patient ever a victim of a crime or a disaster?: No Witnessed domestic violence?: No Has patient been affected by domestic violence as an adult?: No  Child/Adolescent Assessment:     CCA Substance Use  Alcohol/Drug Use: Alcohol / Drug Use Pain Medications: See MAR Prescriptions: See MAR Over the Counter: See MAR History of alcohol / drug use?: Yes Longest period of sobriety (when/how long): Currently using- last night drink 20 cans (12oz)  ASAM's:  Six Dimensions of Multidimensional Assessment  Dimension 1:  Acute Intoxication and/or Withdrawal Potential:       Dimension 2:  Biomedical Conditions and Complications:      Dimension 3:  Emotional, Behavioral, or Cognitive Conditions and Complications:     Dimension 4:  Readiness to Change:     Dimension 5:  Relapse, Continued use, or Continued Problem Potential:     Dimension 6:  Recovery/Living Environment:     ASAM Severity Score:    ASAM Recommended Level of Treatment:     Substance use Disorder (SUD) Substance Use Disorder (SUD)  Checklist Symptoms of Substance Use: Social, occupational, recreational activities given up or reduced due to use, Substance(s) often taken in larger amounts or over longer times than was intended, Evidence of tolerance, Presence of craving or strong urge to use, Continued use despite persistent or recurrent social, interpersonal problems, caused or exacerbated by use, Continued use despite having a persistent/recurrent physical/psychological problem caused/exacerbated by use  Recommendations for Services/Supports/Treatments: Recommendations for Services/Supports/Treatments Recommendations For Services/Supports/Treatments: Individual Therapy  DSM5 Diagnoses: Patient Active Problem List   Diagnosis Date Noted  . Preventative health care 12/12/2019  . Pain in extremity 09/19/2019  . Cervical radiculopathy 09/19/2019  . Lumbar radiculopathy 09/19/2019  . Numbness 09/19/2019    Patient Centered Plan: Patient is on the following Treatment Plan(s):  PTSD/Anxiety/Depression/Alcohol Abuse  Referrals to Alternative Service(s): Referred to Alternative Service(s):   Place:   Date:   Time:    Referred to Alternative Service(s):   Place:   Date:   Time:    Referred to Alternative Service(s):   Place:   Date:   Time:    Referred to Alternative Service(s):   Place:   Date:   Time:     I discussed the assessment and treatment plan with the patient. The patient was provided an opportunity to ask questions and all were answered. The patient agreed with the plan and  demonstrated an understanding of the instructions.   The patient was advised to call back or seek an in-person evaluation if the symptoms worsen or if the condition fails to improve as anticipated.  I provided 60 minutes of non-face-to-face time during this encounter.   Winfred Burn , LCSW 01/09/2020

## 2020-01-25 NOTE — Patient Instructions (Addendum)
Mason Ross  01/25/2020     @PREFPERIOPPHARMACY @   Your procedure is scheduled on 01/30/2020.  Report to 02/01/2020 at 8:15 A.M.  Call this number if you have problems the morning of surgery:  [phone]   Remember:  Do not eat or drink after midnight.   Please follow prep instructions given to you by Dr. [phone] office.    Take these medicines the morning of surgery with A SIP OF WATER : prilosec and trileptal    Do not wear jewelry, make-up or nail polish.  Do not wear lotions, powders, or perfumes, or deodorant.  Do not shave 48 hours prior to surgery.  Men may shave face and neck.  Do not bring valuables to the hospital.  [facility] is not responsible for any belongings or valuables.  Contacts, dentures or bridgework may not be worn into surgery.  Leave your suitcase in the car.  After surgery it may be brought to your room.  For patients admitted to the hospital, discharge time will be determined by your treatment team.  Patients discharged the day of surgery will not be allowed to drive home.   Name and phone number of your driver:   family Special instructions:    Please read over the following fact sheets that you were given. Care and Recovery After Surgery  Colonoscopy, Adult A colonoscopy is a procedure to look at the entire large intestine. This procedure is done using a long, thin, flexible tube that has a camera on the end. You may have a colonoscopy:  As a part of normal colorectal screening.  If you have certain symptoms, such as: ? A low number of red blood cells in your blood (anemia). ? Diarrhea that does not go away. ? Pain in your abdomen. ? Blood in your stool. A colonoscopy can help screen for and diagnose medical problems, including:  Tumors.  Extra tissue that grows where mucus forms (polyps).  Inflammation.  Areas of bleeding. Tell your health care provider about:  Any allergies you have.  All medicines you are taking,  including vitamins, herbs, eye drops, creams, and over-the-counter medicines.  Any problems you or family members have had with anesthetic medicines.  Any blood disorders you have.  Any surgeries you have had.  Any medical conditions you have.  Any problems you have had with having bowel movements.  Whether you are pregnant or may be pregnant. What are the risks? Generally, this is a safe procedure. However, problems may occur, including:  Bleeding.  Damage to your intestine.  Allergic reactions to medicines given during the procedure.  Infection. This is rare. What happens before the procedure? Eating and drinking restrictions Follow instructions from your health care provider about eating or drinking restrictions, which may include:  A few days before the procedure: ? Follow a low-fiber diet. ? Avoid nuts, seeds, dried fruit, raw fruits, and vegetables.  1-3 days before the procedure: ? Eat only gelatin dessert or ice pops. ? Drink only clear liquids, such as water, clear juice, clear broth or bouillon, black coffee or tea, or clear soft drinks or sports drinks. ? Avoid liquids that contain red or purple dye.  The day of the procedure: ? Do not eat solid foods. You may continue to drink clear liquids until up to 2 hours before the procedure. ? Do not eat or drink anything starting 2 hours before the procedure, or within the time period that your health care provider recommends. Bowel prep  If you were prescribed a bowel prep to take by mouth (orally) to clean out your colon:  Take it as told by your health care provider. Starting the day before your procedure, you will need to drink a large amount of liquid medicine. The liquid will cause you to have many bowel movements of loose stool until your stool becomes almost clear or light green.  If your skin or the opening between the buttocks (anus) gets irritated from diarrhea, you may relieve the irritation using: ? Wipes  with medicine in them, such as adult wet wipes with aloe and vitamin E. ? A product to soothe skin, such as petroleum jelly.  If you vomit while drinking the bowel prep: ? Take a break for up to 60 minutes. ? Begin the bowel prep again. ? Call your health care provider if you keep vomiting or you cannot take the bowel prep without vomiting.  To clean out your colon, you may also be given: ? Laxative medicines. These help you have a bowel movement. ? Instructions for enema use. An enema is liquid medicine injected into your rectum. Medicines Ask your health care provider about:  Changing or stopping your regular medicines or supplements. This is especially important if you are taking iron supplements, diabetes medicines, or blood thinners.  Taking medicines such as aspirin and ibuprofen. These medicines can thin your blood. Do not take these medicines unless your health care provider tells you to take them.  Taking over-the-counter medicines, vitamins, herbs, and supplements. General instructions  Ask your health care provider what steps will be taken to help prevent infection. These may include washing skin with a germ-killing soap.  Plan to have someone take you home from the hospital or clinic. What happens during the procedure?   An IV will be inserted into one of your veins.  You may be given one or more of the following: ? A medicine to help you relax (sedative). ? A medicine to numb the area (local anesthetic). ? A medicine to make you fall asleep (general anesthetic). This is rarely needed.  You will lie on your side with your knees bent.  The tube will: ? Have oil or gel put on it (be lubricated). ? Be inserted into your anus. ? Be gently eased through all parts of your large intestine.  Air will be sent into your colon to keep it open. This may cause some pressure or cramping.  Images will be taken with the camera and will appear on a screen.  A small tissue  sample may be removed to be looked at under a microscope (biopsy). The tissue may be sent to a lab for testing if any signs of problems are found.  If small polyps are found, they may be removed and checked for cancer cells.  When the procedure is finished, the tube will be removed. The procedure may vary among health care providers and hospitals. What happens after the procedure?  Your blood pressure, heart rate, breathing rate, and blood oxygen level will be monitored until you leave the hospital or clinic.  You may have a small amount of blood in your stool.  You may pass gas and have mild cramping or bloating in your abdomen. This is caused by the air that was used to open your colon during the exam.  Do not drive for 24 hours after the procedure.  It is up to you to get the results of your procedure. Ask your health care provider,  or the department that is doing the procedure, when your results will be ready. Summary  A colonoscopy is a procedure to look at the entire large intestine.  Follow instructions from your health care provider about eating and drinking before the procedure.  If you were prescribed an oral bowel prep to clean out your colon, take it as told by your health care provider.  During the colonoscopy, a flexible tube with a camera on its end is inserted into the anus and then passed into the other parts of the large intestine. This information is not intended to replace advice given to you by your health care provider. Make sure you discuss any questions you have with your health care provider. Document Revised: 11/18/2018 Document Reviewed: 11/18/2018 Elsevier Patient Education  2020 [employer]       PATIENT INSTRUCTIONS POST-ANESTHESIA  IMMEDIATELY FOLLOWING SURGERY:  Do not drive or operate machinery for the first twenty four hours after surgery.  Do not make any important decisions for twenty four hours after surgery or while taking narcotic pain  medications or sedatives.  If you develop intractable nausea and vomiting or a severe headache please notify your doctor immediately.  FOLLOW-UP:  Please make an appointment with your surgeon as instructed. You do not need to follow up with anesthesia unless specifically instructed to do so.  WOUND CARE INSTRUCTIONS (if applicable):  Keep a dry clean dressing on the anesthesia/puncture wound site if there is drainage.  Once the wound has quit draining you may leave it open to air.  Generally you should leave the bandage intact for twenty four hours unless there is drainage.  If the epidural site drains for more than 36-48 hours please call the anesthesia department.  QUESTIONS?:  Please feel free to call your physician or the hospital operator if you have any questions, and they will be happy to assist you.

## 2020-01-29 ENCOUNTER — Other Ambulatory Visit (HOSPITAL_COMMUNITY)
Admission: RE | Admit: 2020-01-29 | Discharge: 2020-01-29 | Disposition: A | Discharge status: Home / Self Care | Payer: No Typology Code available for payment source | Source: Ambulatory Visit | Attending: Internal Medicine | Admitting: Internal Medicine

## 2020-01-29 ENCOUNTER — Encounter (HOSPITAL_COMMUNITY)
Admission: RE | Admit: 2020-01-29 | Discharge: 2020-01-29 | Disposition: A | Discharge status: Home / Self Care | Payer: No Typology Code available for payment source | Source: Ambulatory Visit | Attending: Internal Medicine | Admitting: Internal Medicine

## 2020-01-29 ENCOUNTER — Encounter (HOSPITAL_COMMUNITY): Payer: Self-pay

## 2020-01-29 ENCOUNTER — Other Ambulatory Visit: Payer: Self-pay

## 2020-01-29 DIAGNOSIS — Z20822 Contact with and (suspected) exposure to covid-19: Secondary | ICD-10-CM | POA: Diagnosis not present

## 2020-01-29 DIAGNOSIS — Z01812 Encounter for preprocedural laboratory examination: Secondary | ICD-10-CM | POA: Insufficient documentation

## 2020-01-29 HISTORY — DX: Sleep apnea, unspecified: G47.30

## 2020-01-29 HISTORY — DX: Nausea with vomiting, unspecified: R11.2

## 2020-01-29 HISTORY — DX: Other specified postprocedural states: Z98.890

## 2020-01-29 LAB — BASIC METABOLIC PANEL
Anion gap: 9 (ref 5–15)
BUN: 14 mg/dL (ref 6–20)
CO2: 27 mmol/L (ref 22–32)
Calcium: 9.3 mg/dL (ref 8.9–10.3)
Chloride: 101 mmol/L (ref 98–111)
Creatinine, Ser: 1.11 mg/dL (ref 0.61–1.24)
GFR calc Af Amer: >60 mL/min (ref >60)
GFR calc non Af Amer: >60 mL/min (ref >60)
Glucose, Bld: 144 mg/dL — ABNORMAL HIGH (ref 70–99)
Potassium: 3.7 mmol/L (ref 3.5–5.1)
Sodium: 137 mmol/L (ref 135–145)

## 2020-01-29 LAB — SARS CORONAVIRUS 2 (TAT 6-24 HRS): SARS Coronavirus 2: NEGATIVE

## 2020-01-30 ENCOUNTER — Ambulatory Visit (HOSPITAL_COMMUNITY): Payer: No Typology Code available for payment source | Admitting: Anesthesiology

## 2020-01-30 ENCOUNTER — Ambulatory Visit (HOSPITAL_COMMUNITY)
Admission: RE | Admit: 2020-01-30 | Discharge: 2020-01-30 | Disposition: A | Discharge status: Home / Self Care | Payer: No Typology Code available for payment source | Attending: Internal Medicine | Admitting: Internal Medicine

## 2020-01-30 ENCOUNTER — Other Ambulatory Visit: Payer: Self-pay

## 2020-01-30 ENCOUNTER — Encounter (HOSPITAL_COMMUNITY): Payer: Self-pay | Admitting: *Deleted

## 2020-01-30 ENCOUNTER — Encounter (HOSPITAL_COMMUNITY)
Admission: RE | Disposition: A | Discharge status: Home / Self Care | Payer: Self-pay | Source: Home / Self Care | Attending: Internal Medicine

## 2020-01-30 DIAGNOSIS — Z8616 Personal history of COVID-19: Secondary | ICD-10-CM | POA: Diagnosis not present

## 2020-01-30 DIAGNOSIS — M542 Cervicalgia: Secondary | ICD-10-CM | POA: Insufficient documentation

## 2020-01-30 DIAGNOSIS — M549 Dorsalgia, unspecified: Secondary | ICD-10-CM | POA: Insufficient documentation

## 2020-01-30 DIAGNOSIS — F17290 Nicotine dependence, other tobacco product, uncomplicated: Secondary | ICD-10-CM | POA: Diagnosis not present

## 2020-01-30 DIAGNOSIS — M1990 Unspecified osteoarthritis, unspecified site: Secondary | ICD-10-CM | POA: Diagnosis not present

## 2020-01-30 DIAGNOSIS — K635 Polyp of colon: Secondary | ICD-10-CM | POA: Diagnosis not present

## 2020-01-30 DIAGNOSIS — Z1211 Encounter for screening for malignant neoplasm of colon: Secondary | ICD-10-CM | POA: Diagnosis not present

## 2020-01-30 DIAGNOSIS — Z791 Long term (current) use of non-steroidal anti-inflammatories (NSAID): Secondary | ICD-10-CM | POA: Diagnosis not present

## 2020-01-30 DIAGNOSIS — K5730 Diverticulosis of large intestine without perforation or abscess without bleeding: Secondary | ICD-10-CM | POA: Diagnosis not present

## 2020-01-30 DIAGNOSIS — Z888 Allergy status to other drugs, medicaments and biological substances status: Secondary | ICD-10-CM | POA: Diagnosis not present

## 2020-01-30 DIAGNOSIS — G4730 Sleep apnea, unspecified: Secondary | ICD-10-CM | POA: Insufficient documentation

## 2020-01-30 DIAGNOSIS — Z79899 Other long term (current) drug therapy: Secondary | ICD-10-CM | POA: Insufficient documentation

## 2020-01-30 DIAGNOSIS — K219 Gastro-esophageal reflux disease without esophagitis: Secondary | ICD-10-CM | POA: Insufficient documentation

## 2020-01-30 HISTORY — PX: COLONOSCOPY WITH PROPOFOL: SHX5780

## 2020-01-30 HISTORY — PX: POLYPECTOMY: SHX5525

## 2020-01-30 LAB — GLUCOSE, CAPILLARY: Glucose-Capillary: 137 mg/dL — ABNORMAL HIGH (ref 70–99)

## 2020-01-30 SURGERY — COLONOSCOPY WITH PROPOFOL
Anesthesia: General

## 2020-01-30 MED ORDER — CHLORHEXIDINE GLUCONATE CLOTH 2 % EX PADS: 6.0000 | MEDICATED_PAD | Freq: Once | CUTANEOUS | Status: DC

## 2020-01-30 MED ORDER — LACTATED RINGERS IV SOLN: INTRAVENOUS | Status: DC | PRN

## 2020-01-30 MED ORDER — LACTATED RINGERS IV SOLN: Freq: Once | INTRAVENOUS | Status: AC

## 2020-01-30 MED ORDER — PROPOFOL 10 MG/ML IV BOLUS
INTRAVENOUS | Status: DC | PRN
  Administered 2020-01-30: 100 mg via INTRAVENOUS

## 2020-01-30 MED ORDER — PROPOFOL 500 MG/50ML IV EMUL
INTRAVENOUS | Status: DC | PRN
  Administered 2020-01-30: 150 ug/kg/min via INTRAVENOUS

## 2020-01-30 MED ORDER — STERILE WATER FOR IRRIGATION IR SOLN
Status: DC | PRN
  Administered 2020-01-30: 100 mL

## 2020-01-30 NOTE — Transfer of Care (Signed)
Immediate Anesthesia Transfer of Care Note  Patient: Mason Ross  Procedure(s) Performed: COLONOSCOPY WITH PROPOFOL (N/A ) POLYPECTOMY  Patient Location: PACU  Anesthesia Type:General  Level of Consciousness: awake, alert , oriented and patient cooperative  Airway & Oxygen Therapy: Patient Spontanous Breathing  Post-op Assessment: Report given to RN, Post -op Vital signs reviewed and stable and Patient moving all extremities X 4  Post vital signs: Reviewed and stable  Last Vitals:  Vitals Value Taken Time  BP    Temp    Pulse 88 01/30/20 1012  Resp 17 01/30/20 1012  SpO2 95 % 01/30/20 1012  Vitals shown include unvalidated device data.  Last Pain:  Vitals:   01/30/20 0948  TempSrc:   PainSc: 1          Complications: No complications documented.

## 2020-01-30 NOTE — Discharge Instructions (Signed)
  Colonoscopy Discharge Instructions  Read the instructions outlined below and refer to this sheet in the next few weeks. These discharge instructions provide you with general information on caring for yourself after you leave the hospital. Your doctor may also give you specific instructions. While your treatment has been planned according to the most current medical practices available, unavoidable complications occasionally occur.   ACTIVITY  You may resume your regular activity, but move at a slower pace for the next 24 hours.   Take frequent rest periods for the next 24 hours.   Walking will help get rid of the air and reduce the bloated feeling in your belly (abdomen).   No driving for 24 hours (because of the medicine (anesthesia) used during the test).    Do not sign any important legal documents or operate any machinery for 24 hours (because of the anesthesia used during the test).  NUTRITION  Drink plenty of fluids.   You may resume your normal diet as instructed by your doctor.   Begin with a light meal and progress to your normal diet. Heavy or fried foods are harder to digest and may make you feel sick to your stomach (nauseated).   Avoid alcoholic beverages for 24 hours or as instructed.  MEDICATIONS  You may resume your normal medications unless your doctor tells you otherwise.  WHAT YOU CAN EXPECT TODAY  Some feelings of bloating in the abdomen.   Passage of more gas than usual.   Spotting of blood in your stool or on the toilet paper.  IF YOU HAD POLYPS REMOVED DURING THE COLONOSCOPY:  No aspirin products for 7 days or as instructed.   No alcohol for 7 days or as instructed.   Eat a soft diet for the next 24 hours.  FINDING OUT THE RESULTS OF YOUR TEST Not all test results are available during your visit. If your test results are not back during the visit, make an appointment with your caregiver to find out the results. Do not assume everything is normal if  you have not heard from your caregiver or the medical facility. It is important for you to follow up on all of your test results.  SEEK IMMEDIATE MEDICAL ATTENTION IF:  You have more than a spotting of blood in your stool.   Your belly is swollen (abdominal distention).   You are nauseated or vomiting.   You have a temperature over 101.   You have abdominal pain or discomfort that is severe or gets worse throughout the day.   Your colonoscopy was relatively unremarkable.  I did remove one small polyp.  Depending on pathology I recommend we repeat colonoscopy in 7 to 10 years.  My office will contact you with these results.  Otherwise, follow-up with GI as needed.  I hope you have a great rest of your week!  Hennie Duos. Marletta Lor, D.O. Gastroenterology and Hepatology [facility]

## 2020-01-30 NOTE — Anesthesia Postprocedure Evaluation (Signed)
Anesthesia Post Note  Patient: Caryl Manas  Procedure(s) Performed: COLONOSCOPY WITH PROPOFOL (N/A ) POLYPECTOMY  Patient location during evaluation: PACU Anesthesia Type: General Level of consciousness: awake, oriented, awake and alert and patient cooperative Pain management: satisfactory to patient Vital Signs Assessment: post-procedure vital signs reviewed and stable Respiratory status: spontaneous breathing, respiratory function stable and nonlabored ventilation Cardiovascular status: stable Postop Assessment: no apparent nausea or vomiting Anesthetic complications: no   No complications documented.   Last Vitals:  Vitals:   01/30/20 0832 01/30/20 0848  BP: (!) 130/92 132/87  Pulse: 60   Resp: 15   Temp: 37 C   SpO2: 95%     Last Pain:  Vitals:   01/30/20 0948  TempSrc:   PainSc: 1                  Neko Mcgeehan

## 2020-01-30 NOTE — H&P (Signed)
Primary Care Physician:  [facility] Primary Gastroenterologist:  Dr. Marletta Lor  Pre-Procedure History & Physical: HPI:  Mason Ross is a 54 y.o. male is here for a screening colonoscopy.   Past Medical History:  Diagnosis Date  . Arthritis   . Back pain   . COVID-19 virus infection 04/2019   Fully recovered  . GERD (gastroesophageal reflux disease)   . Neck pain   . PONV (postoperative nausea and vomiting)   . Sleep apnea     Past Surgical History:  Procedure Laterality Date  . CARPAL TUNNEL RELEASE Bilateral   . CUBITAL RELEASE Bilateral   . KNEE ARTHROSCOPY    . OSTEOTOMY Bilateral    arms    Prior to Admission medications   Medication Sig Start Date End Date Taking? Authorizing Provider  celecoxib (CELEBREX) 200 MG capsule Take 200 mg by mouth daily.    Yes [provider]  diclofenac Sodium (VOLTAREN) 1 % GEL Apply 2 g topically 3 (three) times a week.   Yes [provider]  omeprazole (PRILOSEC) 20 MG capsule Take 20 mg by mouth daily.   Yes [provider]  Oxcarbazepine (TRILEPTAL) 300 MG tablet Take 600 mg by mouth 2 (two) times daily.   Yes [provider]    Allergies as of 12/18/2019 - Review Complete 12/12/2019  Allergen Reaction Noted  . Duloxetine  12/12/2019  . Gabapentin  12/12/2019    Family History  Problem Relation Age of Onset  . Cancer Father        prostate  . Colon cancer Neg Hx     Social History   Socioeconomic History  . Marital status: Legally Separated    Spouse name: Not on file  . Number of children: Not on file  . Years of education: Not on file  . Highest education level: Not on file  Occupational History  . Not on file  Tobacco Use  . Smoking status: Never Smoker  . Smokeless tobacco: Current User    Types: Snuff  . Tobacco comment: occasional  Substance and Sexual Activity  . Alcohol use: Yes    Alcohol/week: 6.0 standard drinks    Types: 6 Cans of beer per week    Comment:  12-20 drinks once weekly  . Drug use: No  . Sexual activity: Not on file  Other Topics Concern  . Not on file  Social History Narrative  . Not on file   Social Determinants of Health   Financial Resource Strain:   . Difficulty of Paying Living Expenses: Not on file  Food Insecurity:   . Worried About Programme researcher, broadcasting/film/video in the Last Year: Not on file  . Ran Out of Food in the Last Year: Not on file  Transportation Needs:   . Lack of Transportation (Medical): Not on file  . Lack of Transportation (Non-Medical): Not on file  Physical Activity:   . Days of Exercise per Week: Not on file  . Minutes of Exercise per Session: Not on file  Stress:   . Feeling of Stress : Not on file  Social Connections:   . Frequency of Communication with Friends and Family: Not on file  . Frequency of Social Gatherings with Friends and Family: Not on file  . Attends Religious Services: Not on file  . Active Member of Clubs or Organizations: Not on file  . Attends Banker Meetings: Not on file  . Marital Status: Not on file  Intimate Partner  Violence:   . Fear of Current or Ex-Partner: Not on file  . Emotionally Abused: Not on file  . Physically Abused: Not on file  . Sexually Abused: Not on file    Review of Systems: See HPI, otherwise negative ROS  Impression/Plan: Mason Ross is here for a colonoscopy to be performed for screening  Risks, benefits, limitations, imponderables and alternatives regarding colonoscopy have been reviewed with the patient. Questions have been answered. All parties agreeable.

## 2020-01-30 NOTE — Op Note (Signed)
[facility] Patient Name: Mason Ross Procedure Date: 01/30/2020 9:36 AM MRN: [id number] Date of Birth: 1965/07/13 Attending MD: Elon Alas. Edgar Frisk CSN: [id number] Age: 54 Admit Type: Outpatient Procedure:                Colonoscopy Indications:              Screening for colorectal malignant neoplasm Providers:                Elon Alas. Abbey Chatters, DO, Caprice Kluver, Crystal Page,                            Casimer Bilis, Technician, Aram Candela Referring MD:              Medicines:                Monitored Anesthesia Care Complications:            No immediate complications. Estimated Blood Loss:     Estimated blood loss was minimal. Procedure:                Pre-Anesthesia Assessment:                           - The anesthesia plan was to use monitored                            anesthesia care (MAC).                           After obtaining informed consent, the colonoscope                            was passed under direct vision. Throughout the                            procedure, the patient's blood pressure, pulse, and                            oxygen saturations were monitored continuously. The                            [id number]-[id number] ([id number]) scope was introduced through                            the anus and advanced to the the terminal ileum,                            with identification of the appendiceal orifice and                            IC valve. The colonoscopy was performed without                            difficulty. The patient tolerated the procedure                            well. The  quality of the bowel preparation was                            evaluated using the BBPS [facility] Bowel Preparation                            Scale) with scores of: Right Colon = 2 (minor                            amount of residual staining, small fragments of                            stool and/or opaque liquid, but mucosa seen well),                             Transverse Colon = 2 (minor amount of residual                            staining, small fragments of stool and/or opaque                            liquid, but mucosa seen well) and Left Colon = 2                            (minor amount of residual staining, small fragments                            of stool and/or opaque liquid, but mucosa seen                            well). The total BBPS score equals 6. The quality                            of the bowel preparation was fair. Scope In: 9:52:03 AM Scope Out: 22:97:98 AM Scope Withdrawal Time: 0 hours 11 minutes 46 seconds  Total Procedure Duration: 0 hours 14 minutes 1 second  Findings:      The perianal and digital rectal examinations were normal.      Multiple small-mouthed diverticula were found in the sigmoid colon and       descending colon.      A 2 mm polyp was found in the sigmoid colon. The polyp was sessile. The       polyp was removed with a jumbo cold forceps. Resection and retrieval       were complete.      The exam was otherwise without abnormality. Impression:               - Preparation of the colon was fair.                           - Diverticulosis in the sigmoid colon and in the                            descending colon.                           -  One 2 mm polyp in the sigmoid colon, removed with                            a jumbo cold forceps. Resected and retrieved.                           - The examination was otherwise normal. Moderate Sedation:      Per Anesthesia Care Recommendation:           - Patient has a contact number available for                            emergencies. The signs and symptoms of potential                            delayed complications were discussed with the                            patient. Return to normal activities tomorrow.                            Written discharge instructions were provided to the                            patient.                            - Resume previous diet.                           - Continue present medications.                           - Await pathology results.                           - Repeat colonoscopy in 7-10 years for surveillance                            based on pathology results.                           - Return to GI clinic PRN. Procedure Code(s):        --- Professional ---                           [id number], Colonoscopy, flexible; with biopsy, single                            or multiple Diagnosis Code(s):        --- Professional ---                           Z12.11, Encounter for screening for malignant                            neoplasm  of colon                           K63.5, Polyp of colon                           K57.30, Diverticulosis of large intestine without                            perforation or abscess without bleeding CPT copyright 2019 American Medical Association. All rights reserved. The codes documented in this report are preliminary and upon coder review may  be revised to meet current compliance requirements. Elon Alas. Abbey Chatters, [place] Abbey Chatters, DO 01/30/2020 10:10:47 AM This report has been signed electronically. Number of Addenda: 0

## 2020-01-30 NOTE — Anesthesia Preprocedure Evaluation (Signed)
Anesthesia Evaluation  Patient identified by MRN, date of birth, ID band Patient awake    Reviewed: Allergy & Precautions, NPO status , Patient's Chart, lab work & pertinent test results  History of Anesthesia Complications (+) PONV and history of anesthetic complications  Airway Mallampati: III  TM Distance: >3 FB Neck ROM: Full    Dental  (+) Dental Advisory Given, Chipped   Pulmonary sleep apnea and Continuous Positive Airway Pressure Ventilation , pneumonia (covid -04/2019), resolved,    Pulmonary exam normal breath sounds clear to auscultation       Cardiovascular negative cardio ROS Normal cardiovascular exam Rhythm:Regular Rate:Normal     Neuro/Psych Memory problems  from covid 19  Neuromuscular disease negative psych ROS   GI/Hepatic GERD  ,(+)     substance abuse  alcohol use,   Endo/Other    Renal/GU      Musculoskeletal  (+) Arthritis  (back pain, neck pain),   Abdominal   Peds  Hematology   Anesthesia Other Findings   Reproductive/Obstetrics                            Anesthesia Physical Anesthesia Plan  ASA: III  Anesthesia Plan: General   Post-op Pain Management:    Induction: Intravenous  PONV Risk Score and Plan: TIVA  Airway Management Planned: Nasal Cannula and Natural Airway  Additional Equipment:   Intra-op Plan:   Post-operative Plan:   Informed Consent: I have reviewed the patients History and Physical, chart, labs and discussed the procedure including the risks, benefits and alternatives for the proposed anesthesia with the patient or authorized representative who has indicated his/her understanding and acceptance.     Dental advisory given  Plan Discussed with: CRNA and Surgeon  Anesthesia Plan Comments:        Anesthesia Quick Evaluation

## 2020-01-31 ENCOUNTER — Ambulatory Visit (INDEPENDENT_AMBULATORY_CARE_PROVIDER_SITE_OTHER): Payer: No Typology Code available for payment source | Admitting: Clinical

## 2020-01-31 ENCOUNTER — Other Ambulatory Visit: Payer: Self-pay

## 2020-01-31 DIAGNOSIS — F1020 Alcohol dependence, uncomplicated: Secondary | ICD-10-CM

## 2020-01-31 DIAGNOSIS — F331 Major depressive disorder, recurrent, moderate: Secondary | ICD-10-CM

## 2020-01-31 DIAGNOSIS — F4310 Post-traumatic stress disorder, unspecified: Secondary | ICD-10-CM

## 2020-01-31 DIAGNOSIS — F419 Anxiety disorder, unspecified: Secondary | ICD-10-CM

## 2020-01-31 LAB — SURGICAL PATHOLOGY

## 2020-01-31 NOTE — Progress Notes (Signed)
Virtual Visit via Video Note  I connected with Mason Ross on 01/31/20 at  1:00 PM EDT by a video enabled telemedicine application and verified that I am speaking with the correct person using two identifiers.   Location: Patient:Home Provider:Office  I discussed the limitations, risks, security and privacy concerns of performing an evaluation and management service by telephone and the availability of in person appointments. I also discussed with the patient that there may be a patient responsible charge related to this service. The patient expressed understanding and agreed to proceed.      THERAPIST PROGRESS NOTE  Session Time:1:00PM-1:55PM  Participation Level:Active  Behavioral Response:CasualAlertDepressed  Type of Therapy:Individual Therapy  Treatment Goals addressed:Coping  Interventions:CBT and Strength-based  Summary:Mason Ross is a 54 y.o.malewho presents with Depression/PTSD/Alcohol Use Disorder.The OPT therapist worked with thepatientfor his initial OPT treatment. The OPT therapist utilized Motivational Interviewing to assist in creating therapeutic repore. The patient in the session was engaged and work in collaboration giving feedback about his triggers and symptoms over the past few weeksincluding health concerns, pain, and limited physical functioning.The OPT therapist utilized Cognitive Behavioral Therapy through cognitive restructuring as well as worked with the patient on reviewing history of Depression. The OPT therapist provided psycho-education throughout the session. The patient is currently involved with health services including pain management and neurology.   Suicidal/Homicidal:Nowithout intent/plan  Therapist Response:The OPT therapist worked with the patient for the patients scheduled session. The patient was engaged in his session and gave feedback in relation to triggers, symptoms, and behavior responses over the  pastfewweeks.The patient notedconcern in his irritability and difficulty in social situations or being around others outside of his family.The OPT therapist worked with the patient utilizing an in session Cognitive Behavioral Therapy exercise. The patient indicated [facility] patient and processed with the patient the impact of his health problems in relation to his current functioning.The OPT therapist will continue treatment work with the patient in his next scheduled session.  Plan: Return again in2/3weeks.  Diagnosis:Axis I:PTSD/Depression,Alcohol Use Disorder Axis II:No diagnosis  I discussed the assessment and treatment plan with the patient. The patient was provided an opportunity to ask questions and all were answered. The patient agreed with the plan and demonstrated an understanding of the instructions.  The patient was advised to call back or seek an in-person evaluation if the symptoms worsen or if the condition fails to improve as anticipated.  I provided23minutes of non-face-to-face time during this encounter.  Winfred Burn, LCSW 01/31/2020

## 2020-02-05 ENCOUNTER — Encounter (HOSPITAL_COMMUNITY): Payer: Self-pay | Admitting: Internal Medicine

## 2020-02-14 ENCOUNTER — Ambulatory Visit (INDEPENDENT_AMBULATORY_CARE_PROVIDER_SITE_OTHER): Payer: No Typology Code available for payment source | Admitting: Clinical

## 2020-02-14 ENCOUNTER — Other Ambulatory Visit: Payer: Self-pay

## 2020-02-14 DIAGNOSIS — F331 Major depressive disorder, recurrent, moderate: Secondary | ICD-10-CM | POA: Diagnosis not present

## 2020-02-14 DIAGNOSIS — F4310 Post-traumatic stress disorder, unspecified: Secondary | ICD-10-CM

## 2020-02-14 DIAGNOSIS — F1020 Alcohol dependence, uncomplicated: Secondary | ICD-10-CM

## 2020-02-14 DIAGNOSIS — F419 Anxiety disorder, unspecified: Secondary | ICD-10-CM

## 2020-02-14 NOTE — Progress Notes (Signed)
Virtual Visit via Telephone Note  I connected with [name] on 02/14/20 at 10:00 AM EDT by telephone and verified that I am speaking with the correct person using two identifiers.  Location: Patient:Home Provider:Office  I discussed the limitations, risks, security and privacy concerns of performing an evaluation and management service by telephone and the availability of in person appointments. I also discussed with the patient that there may be a patient responsible charge related to this service. The patient expressed understanding and agreed to proceed.      THERAPIST PROGRESS NOTE  Session Time:10:00AM-10:55AM  Participation Level:Active  Behavioral Response:CasualAlertDepressed  Type of Therapy:Individual Therapy  Treatment Goals addressed:Coping  Interventions:CBT and Strength-based  Summary:Mason Ross is a 54 y.o.malewho presents with Depression/PTSD/Alcohol Use Disorder.The OPT therapist worked with thepatientfor his ongoing OPT treatment. The OPT therapist utilized Motivational Interviewing to assist in creating therapeutic repore. The patient in the session was engaged and work in collaboration giving feedback about his triggers and symptoms over the past few weeksincluding difficulty with adjusting to limited physical functioning and pain in neck, back, and hands.The OPT therapist utilized Cognitive Behavioral Therapy through cognitive restructuring as well as worked with the patient on the importance in maintaining his adherence to health appointments geared towards treating and improving physical health and functioning. The OPT therapist provided psycho-education throughout the session. The patient is currently involved with health services including pain management and neurology.   Suicidal/Homicidal:Nowithout intent/plan  Therapist Response:The OPT therapist worked with the patient for the patients scheduled session. The patient  was engaged in his session and gave feedback in relation to triggers, symptoms, and behavior responses over the pastfewweeks.The patient noted ongoing concern in his irritability and difficulty in social situations or being around others outside of his family as well as frustrations with limited physical ability.The OPT therapist worked with the patient utilizing an in session Cognitive Behavioral Therapy exercise. The patient indicated ongoing involvement with physical health treatment providers [facility] worked with the patient and processed with the patient the impact of hishealth problemsin relation to his current functioning and mental health.The OPT therapist will continue treatment work with the patient in his next scheduled session.  Plan: Return again in2/3weeks.  Diagnosis:Axis I:PTSD/Depression,Alcohol Use Disorder Axis II:No diagnosis  I discussed the assessment and treatment plan with the patient. The patient was provided an opportunity to ask questions and all were answered. The patient agreed with the plan and demonstrated an understanding of the instructions.  The patient was advised to call back or seek an in-person evaluation if the symptoms worsen or if the condition fails to improve as anticipated.  I provided50minutes of non-face-to-face time during this encounter.  Winfred Burn, LCSW 02/14/2020

## 2020-02-29 ENCOUNTER — Ambulatory Visit (INDEPENDENT_AMBULATORY_CARE_PROVIDER_SITE_OTHER): Payer: No Typology Code available for payment source | Admitting: Clinical

## 2020-02-29 ENCOUNTER — Other Ambulatory Visit: Payer: Self-pay

## 2020-02-29 DIAGNOSIS — F419 Anxiety disorder, unspecified: Secondary | ICD-10-CM

## 2020-02-29 DIAGNOSIS — F1020 Alcohol dependence, uncomplicated: Secondary | ICD-10-CM

## 2020-02-29 DIAGNOSIS — F4310 Post-traumatic stress disorder, unspecified: Secondary | ICD-10-CM | POA: Diagnosis not present

## 2020-02-29 DIAGNOSIS — F331 Major depressive disorder, recurrent, moderate: Secondary | ICD-10-CM | POA: Diagnosis not present

## 2020-02-29 NOTE — Progress Notes (Signed)
Virtual Visit via Telephone Note  I connected withJohn Ross on 02/29/20 at 10:00 AM EDT by telephoneand verified that I am speaking with the correct person using two identifiers.  Location: Patient:Home Provider:Office  I discussed the limitations, risks, security and privacy concerns of performing an evaluation and management service by telephone and the availability of in person appointments. I also discussed with the patient that there may be a patient responsible charge related to this service. The patient expressed understanding and agreed to proceed.      THERAPIST PROGRESS NOTE  Session Time:10:00AM-10:45AM  Participation Level:Active  Behavioral Response:CasualAlertDepressed  Type of Therapy:Individual Therapy  Treatment Goals addressed:Coping  Interventions:CBT and Strength-based  Summary:Mason Farrisis a 54y.o.malewho presents with Depression/PTSD/Alcohol Use Disorder.The OPT therapist worked with thepatientfor hisongoingOPT treatment. The OPT therapist utilized Motivational Interviewing to assist in creating therapeutic repore. The patient in the session was engaged and work in collaboration giving feedback about his triggers and symptoms over the past few weeksincluding difficulty with interactions outside the home when out in the community.The OPT therapist utilized Cognitive Behavioral Therapy through cognitive restructuring as well as worked with the patient on the importance in maintaining his adherence to health appointments geared towards treating and improving physical health and functioning and continuing to utilize what he feels is helping manage his irritability .The OPT therapist provided psycho-education throughout the session. The patient is currently interacting and functioning within the home setting well with others.  Suicidal/Homicidal:Nowithout intent/plan  Therapist Response:The OPT therapist worked with the  patient for the patients scheduled session. The patient was engaged in his session and gave feedback in relation to triggers, symptoms, and behavior responses over the pastfewweeks.The patient noted ongoing concern inhis irritability and difficulty in social situations or being around others outside of his family and friends as well as frustrations with limited physical ability.The OPT therapist worked with the patient utilizing an in session Cognitive Behavioral Therapy exercise. The patient indicatedongoing involvement with physical health treatment providers and following this health course.The OPT therapist worked with the patient and processed with the patient recent interactions in the community.The OPT therapist will continue treatment work with the patient in his next scheduled session.  Plan: Return again in2/3weeks.  Diagnosis:Axis I:PTSD/Depression,Alcohol Use Disorder Axis II:No diagnosis  I discussed the assessment and treatment plan with the patient. The patient was provided an opportunity to ask questions and all were answered. The patient agreed with the plan and demonstrated an understanding of the instructions.  The patient was advised to call back or seek an in-person evaluation if the symptoms worsen or if the condition fails to improve as anticipated.  I provided10minutes of non-face-to-face time during this encounter.  Mason Burn, LCSW  02/29/2020

## 2020-03-28 ENCOUNTER — Ambulatory Visit (INDEPENDENT_AMBULATORY_CARE_PROVIDER_SITE_OTHER): Payer: No Typology Code available for payment source | Admitting: Clinical

## 2020-03-28 ENCOUNTER — Other Ambulatory Visit: Payer: Self-pay

## 2020-03-28 DIAGNOSIS — F331 Major depressive disorder, recurrent, moderate: Secondary | ICD-10-CM

## 2020-03-28 DIAGNOSIS — F419 Anxiety disorder, unspecified: Secondary | ICD-10-CM

## 2020-03-28 DIAGNOSIS — F4310 Post-traumatic stress disorder, unspecified: Secondary | ICD-10-CM | POA: Diagnosis not present

## 2020-03-28 DIAGNOSIS — F1020 Alcohol dependence, uncomplicated: Secondary | ICD-10-CM | POA: Diagnosis not present

## 2020-03-28 NOTE — Progress Notes (Signed)
  Virtual Visit via Telephone Note  I connected withJohn Farrison 11/18/21at 10:00 AM EDTby telephoneand verified that I am speaking with the correct person using two identifiers.  Location: Patient:Home Provider:Office  I discussed the limitations, risks, security and privacy concerns of performing an evaluation and management service by telephone and the availability of in person appointments. I also discussed with the patient that there may be a patient responsible charge related to this service. The patient expressed understanding and agreed to proceed.      THERAPIST PROGRESS NOTE  Session Time:10:00AM-10:55AM  Participation Level:Active  Behavioral Response:CasualAlertDepressed/Irratible  Type of Therapy:Individual Therapy  Treatment Goals addressed:Coping  Interventions:CBT and Strength-based  Summary:Banks Farrisis a 54y.o.malewho presents with Depression/PTSD/Alcohol Use Disorder.The OPT therapist worked with thepatientfor hisongoingOPT treatment. The OPT therapist utilized Motivational Interviewing to assist in creating therapeutic repore. The patient in the session was engaged and work in collaboration giving feedback about his triggers and symptoms over the past few weeksincludingdifficulty with interactions outside the home when out in the community, recent verbal episode while on a fishing trip with friends.The OPT therapist utilized Cognitive Behavioral Therapy through cognitive restructuring as well as worked with the patienton the importance in maintaining his adherence to health appointments geared towards treating and improving physical health and functioning and continuing to utilize what he feels is helping manage his irritability .The OPT therapist provided psycho-education throughout the session. The OPT therapist overviewed adding Medication Therapy and the patient was receptive and verbalized he will be requesting a  referral from his PCP for psychiatry.  Suicidal/Homicidal:Nowithout intent/plan  Therapist Response:The OPT therapist worked with the patient for the patients scheduled session. The patient was engaged in his session and gave feedback in relation to triggers, symptoms, and behavior responses over the pastfewweeks.The patient noted ongoingconcern inhis irritability and difficulty in social situations or being around others outside of his family and friendsas well as frustrations with limited physical ability.The OPT therapist worked with the patient utilizing an in session Cognitive Behavioral Therapy exercise. The patient  Sequenced a recent interaction with friends during a fishing trip. The OPT therapist suggested adding Medication Therapy to the patients current mental health treatment.The OPT therapist will continue treatment work with the patient in his next scheduled session.  Plan: Return again in2/3weeks.  Diagnosis:Axis I:PTSD/Depression,Alcohol Use Disorder Axis II:No diagnosis  I discussed the assessment and treatment plan with the patient. The patient was provided an opportunity to ask questions and all were answered. The patient agreed with the plan and demonstrated an understanding of the instructions.  The patient was advised to call back or seek an in-person evaluation if the symptoms worsen or if the condition fails to improve as anticipated.  I provided60minutes of non-face-to-face time during this encounter.  Winfred Burn, LCSW 03/28/2020

## 2020-04-18 ENCOUNTER — Ambulatory Visit (HOSPITAL_COMMUNITY): Payer: No Typology Code available for payment source | Admitting: Clinical

## 2020-04-24 ENCOUNTER — Ambulatory Visit (INDEPENDENT_AMBULATORY_CARE_PROVIDER_SITE_OTHER): Payer: No Typology Code available for payment source | Admitting: Clinical

## 2020-04-24 ENCOUNTER — Other Ambulatory Visit: Payer: Self-pay

## 2020-04-24 DIAGNOSIS — F4310 Post-traumatic stress disorder, unspecified: Secondary | ICD-10-CM

## 2020-04-24 DIAGNOSIS — F1020 Alcohol dependence, uncomplicated: Secondary | ICD-10-CM

## 2020-04-24 DIAGNOSIS — F331 Major depressive disorder, recurrent, moderate: Secondary | ICD-10-CM

## 2020-04-24 DIAGNOSIS — F419 Anxiety disorder, unspecified: Secondary | ICD-10-CM

## 2020-04-24 NOTE — Progress Notes (Signed)
  Virtual Visit via Telephone Note  I connected withJohn Ross 12/15/21at 10:00 AM EDTby telephoneand verified that I am speaking with the correct person using two identifiers.  Location: Patient:Home Provider:Office  I discussed the limitations, risks, security and privacy concerns of performing an evaluation and management service by telephone and the availability of in person appointments. I also discussed with the patient that there may be a patient responsible charge related to this service. The patient expressed understanding and agreed to proceed.      THERAPIST PROGRESS NOTE  Session Time:9:00AM-9:40AM  Participation Level:Active  Behavioral Response:CasualAlertDepressed/Irratible  Type of Therapy:Individual Therapy  Treatment Goals addressed:Coping  Interventions:CBT and Strength-based  Summary:Mason Farrisis a 54y.o.malewho presents with Depression/PTSD/Alcohol Use Disorder.The OPT therapist worked with thepatientfor hisongoingOPT treatment. The OPT therapist utilized Motivational Interviewing to assist in creating therapeutic repore. The patient in the session was engaged and work in collaboration giving feedback about his triggers and symptoms over the past few weeksincludingdifficulty with interactions outside the home when out in the community, recent verbal episode with prior employer in relation to a holiday party.The OPT therapist utilized Cognitive Behavioral Therapy through cognitive restructuring as well as worked with the patienton the importance in maintaining his adherence to health appointments geared towards treating and improving physical health and functioning and continuing to utilize what he feels is helping manage his irritability.The OPT therapist provided psycho-education throughout the session.The OPT therapist overviewed adding Medication Therapy and the patient was receptive and verbalized he now has an  appointment for psychiatry on January 29th 2022 through the [place].  Suicidal/Homicidal:Nowithout intent/plan  Therapist Response:The OPT therapist worked with the patient for the patients scheduled session. The patient was engaged in his session and gave feedback in relation to triggers, symptoms, and behavior responses over the pastfewweeks.The patient noted ongoingconcern inhis irritability and difficulty in social situations or being around others outside of his familyand friendsas well as with his girlfriend.The OPT therapist worked with the patient utilizing an in session Cognitive Behavioral Therapy exercise. The patient sequenced a recent interaction with a prior employer over a upcoming holiday party. The  patient does currently have a upcoing psychiatry appointment in January.The OPT therapist will continue treatment work with the patient in his next scheduled session.  Plan: Return again in2/3weeks.  Diagnosis:Axis I:PTSD/Depression,Alcohol Use Disorder Axis II:No diagnosis  I discussed the assessment and treatment plan with the patient. The patient was provided an opportunity to ask questions and all were answered. The patient agreed with the plan and demonstrated an understanding of the instructions.  The patient was advised to call back or seek an in-person evaluation if the symptoms worsen or if the condition fails to improve as anticipated.  I provided22minutes of non-face-to-face time during this encounter.  Winfred Burn, LCSW 04/24/2020

## 2020-05-15 ENCOUNTER — Ambulatory Visit (INDEPENDENT_AMBULATORY_CARE_PROVIDER_SITE_OTHER): Payer: No Typology Code available for payment source | Admitting: Clinical

## 2020-05-15 ENCOUNTER — Other Ambulatory Visit: Payer: Self-pay

## 2020-05-15 DIAGNOSIS — F1020 Alcohol dependence, uncomplicated: Secondary | ICD-10-CM

## 2020-05-15 DIAGNOSIS — F331 Major depressive disorder, recurrent, moderate: Secondary | ICD-10-CM

## 2020-05-15 DIAGNOSIS — F419 Anxiety disorder, unspecified: Secondary | ICD-10-CM | POA: Diagnosis not present

## 2020-05-15 DIAGNOSIS — F4310 Post-traumatic stress disorder, unspecified: Secondary | ICD-10-CM | POA: Diagnosis not present

## 2020-05-15 NOTE — Progress Notes (Signed)
Virtual Visit via Telephone Note  I connected withJohn Ross 1/5/22at 9:00 AM EDTby telephoneand verified that I am speaking with the correct person using two identifiers.  Location: Patient:Home Provider:Office  I discussed the limitations, risks, security and privacy concerns of performing an evaluation and management service by telephone and the availability of in person appointments. I also discussed with the patient that there may be a patient responsible charge related to this service. The patient expressed understanding and agreed to proceed.      THERAPIST PROGRESS NOTE  Session Time:9:00AM-9:45AM  Participation Level:Active  Behavioral Response:CasualAlertDepressed/Irratible  Type of Therapy:Individual Therapy  Treatment Goals addressed:Coping  Interventions:CBT and Strength-based  Summary:Mason Ross a 54y.o.malewho presents with Depression/PTSD/Alcohol Use Disorder.The OPT therapist worked with thepatientfor hisongoingOPT treatment. The OPT therapist utilized Motivational Interviewing to assist in creating therapeutic repore. The patient in the session was engaged and work in collaboration giving feedback about his triggers and symptoms over the past few weeksincludingdifficulty with ongoing physical health pain in neck, hands, and back.The OPT therapist utilized Cognitive Behavioral Therapy through cognitive restructuring as well as worked with the patienton the importance in maintaining his adherence to health appointments geared towards treating and improving physical health and functioning and continuing to utilize what he feels is helping manage his irritability.The OPT therapist provided psycho-education throughout the session.The OPT therapist overviewed adding Medication Therapy and the patient was receptive and verbalized he has a upcoming follow up for the nerve/pain treatment and intents to continue to be compliant  with his health care treatment recommendations.  Suicidal/Homicidal:Nowithout intent/plan  Therapist Response:The OPT therapist worked with the patient for the patients scheduled session. The patient was engaged in his session and gave feedback in relation to triggers, symptoms, and behavior responses over the pastfewweeks.The patient noted ongoingconcern inhis irritability, but awareness of the importance of being compliant with his treatment medications and directives.The OPT therapist worked with the patient utilizing an in session Cognitive Behavioral Therapy exercise. The patientsequenced interaction with his partner during a episode when he was not consistent with his medication that he uses to motivate his consistency citing the feedback from his partner made him even more aware of the intensity of his symptoms when not taking his medication. The  patient does currently have a upcoming psychiatry appointment and nerve/pain management appointment both coming up towards the end of the month.The OPT therapist will continue treatment work with the patient in his next scheduled session.  Plan: Return again in2/3weeks.  Diagnosis:Axis I:PTSD/Depression,Alcohol Use Disorder Axis II:No diagnosis  I discussed the assessment and treatment plan with the patient. The patient was provided an opportunity to ask questions and all were answered. The patient agreed with the plan and demonstrated an understanding of the instructions.  The patient was advised to call back or seek an in-person evaluation if the symptoms worsen or if the condition fails to improve as anticipated.  I provided40minutes of non-face-to-face time during this encounter.  Winfred Burn, LCSW 05/15/2020

## 2020-06-06 ENCOUNTER — Ambulatory Visit (HOSPITAL_COMMUNITY): Payer: No Typology Code available for payment source | Admitting: Clinical

## 2021-07-14 IMAGING — DX DG CHEST 1V PORT
1 series · 1 of 1 positions shown · non-contrast
Comparison: None.

CLINICAL DATA: Dyspnea

EXAM:
PORTABLE CHEST 1 VIEW

[chest ap]
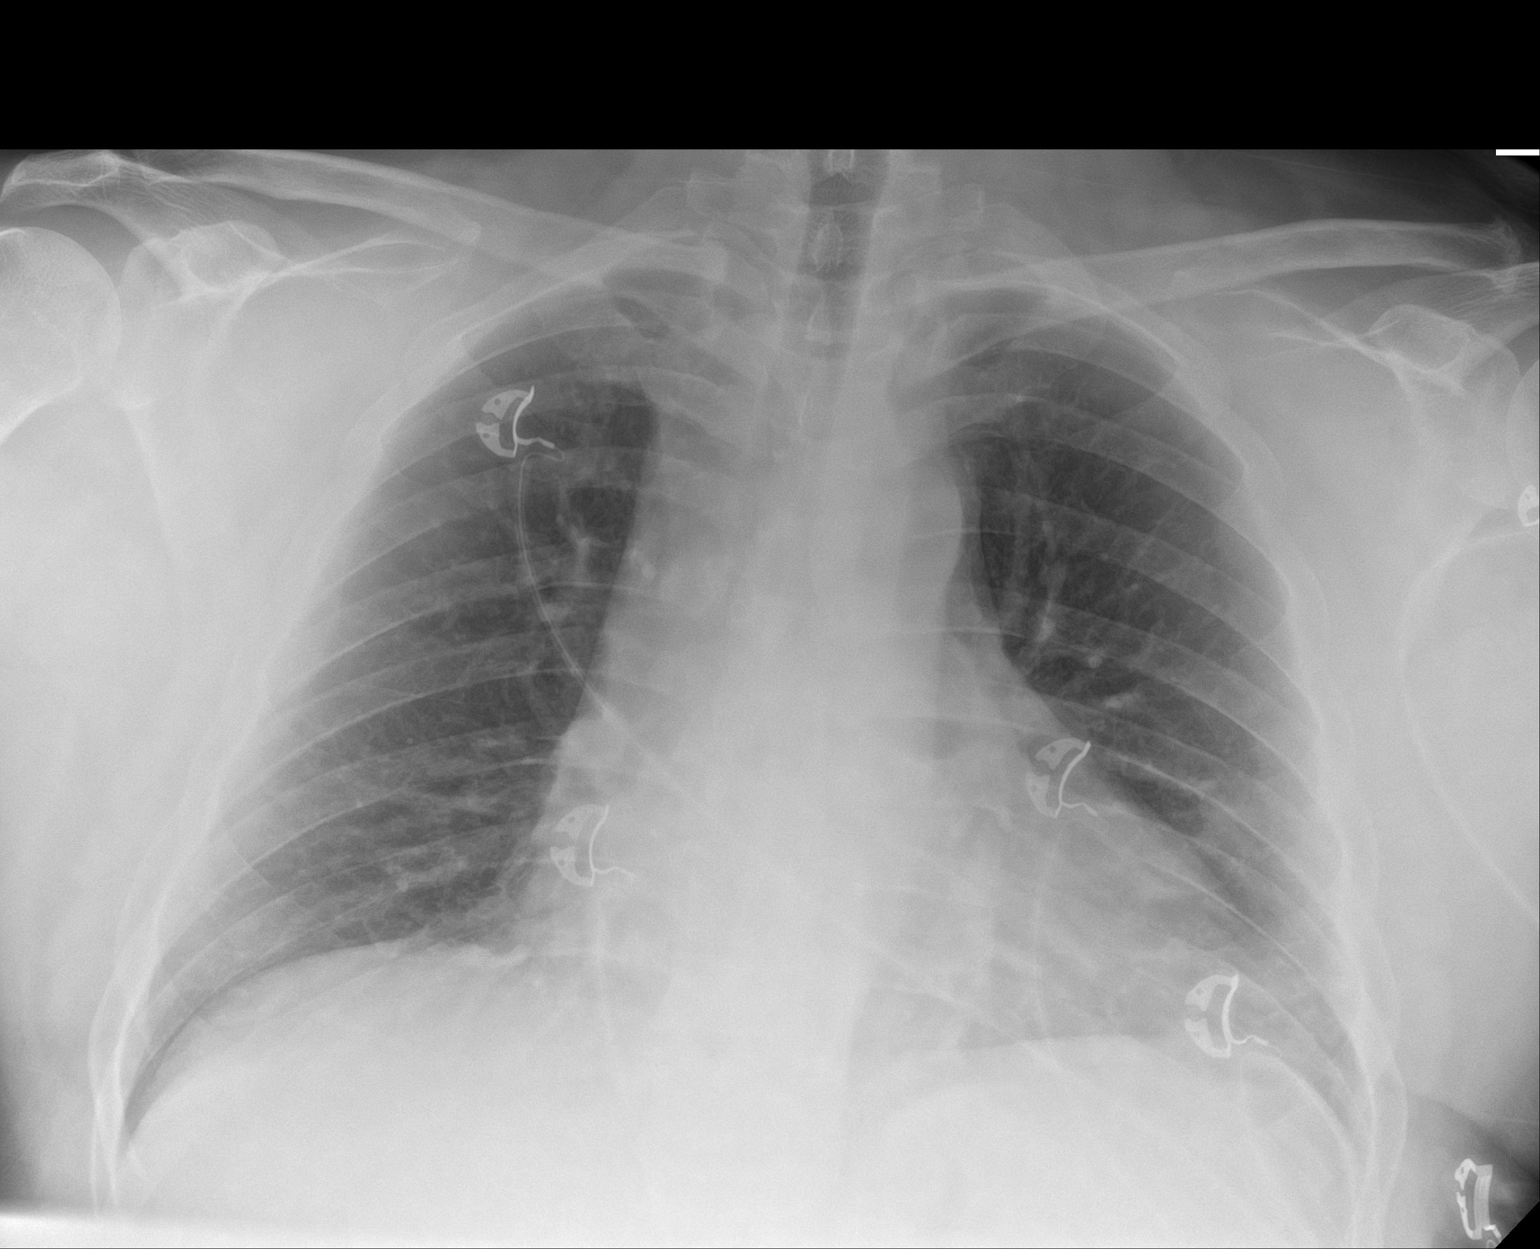

[1 of 1 positions shown; findings below may reference images not displayed]

FINDINGS: Heart size appears mildly enlarged. Airspace opacity within the
peripheral aspect of the left lower lobe. Right lung appears clear.
No pleural effusion or pneumothorax.
IMPRESSION: 1. Airspace opacity within the peripheral aspect of the left lower
lobe, suspicious for pneumonia. Radiographic follow-up to resolution
is recommended.
2. Mild cardiomegaly.
